# Patient Record
Sex: Male | Born: 1935 | Race: White | Hispanic: No | Smoking: Former smoker
Health system: Southern US, Community
[De-identification: ages and names within clinical notes are randomized; demographics above are authoritative.]

---

## 2011-04-27 DIAGNOSIS — E785 Hyperlipidemia, unspecified: Secondary | ICD-10-CM | POA: Diagnosis not present

## 2011-04-27 DIAGNOSIS — E041 Nontoxic single thyroid nodule: Secondary | ICD-10-CM | POA: Diagnosis not present

## 2011-04-27 DIAGNOSIS — M899 Disorder of bone, unspecified: Secondary | ICD-10-CM | POA: Diagnosis not present

## 2011-04-27 DIAGNOSIS — I10 Essential (primary) hypertension: Secondary | ICD-10-CM | POA: Diagnosis not present

## 2011-05-04 DIAGNOSIS — M4120 Other idiopathic scoliosis, site unspecified: Secondary | ICD-10-CM | POA: Diagnosis not present

## 2011-05-11 DIAGNOSIS — M47817 Spondylosis without myelopathy or radiculopathy, lumbosacral region: Secondary | ICD-10-CM | POA: Diagnosis not present

## 2011-05-11 DIAGNOSIS — M7130 Other bursal cyst, unspecified site: Secondary | ICD-10-CM | POA: Diagnosis not present

## 2011-05-11 DIAGNOSIS — M4120 Other idiopathic scoliosis, site unspecified: Secondary | ICD-10-CM | POA: Diagnosis not present

## 2011-05-12 DIAGNOSIS — M48061 Spinal stenosis, lumbar region without neurogenic claudication: Secondary | ICD-10-CM | POA: Diagnosis not present

## 2011-05-14 DIAGNOSIS — E041 Nontoxic single thyroid nodule: Secondary | ICD-10-CM | POA: Diagnosis not present

## 2011-05-14 DIAGNOSIS — E785 Hyperlipidemia, unspecified: Secondary | ICD-10-CM | POA: Diagnosis not present

## 2011-05-14 DIAGNOSIS — I10 Essential (primary) hypertension: Secondary | ICD-10-CM | POA: Diagnosis not present

## 2011-05-14 DIAGNOSIS — IMO0002 Reserved for concepts with insufficient information to code with codable children: Secondary | ICD-10-CM | POA: Diagnosis not present

## 2011-05-19 DIAGNOSIS — M5137 Other intervertebral disc degeneration, lumbosacral region: Secondary | ICD-10-CM | POA: Diagnosis not present

## 2011-05-24 DIAGNOSIS — I495 Sick sinus syndrome: Secondary | ICD-10-CM | POA: Diagnosis not present

## 2011-05-27 DIAGNOSIS — H9190 Unspecified hearing loss, unspecified ear: Secondary | ICD-10-CM | POA: Diagnosis not present

## 2011-05-27 DIAGNOSIS — Z7982 Long term (current) use of aspirin: Secondary | ICD-10-CM | POA: Diagnosis not present

## 2011-05-27 DIAGNOSIS — I10 Essential (primary) hypertension: Secondary | ICD-10-CM | POA: Diagnosis not present

## 2011-05-27 DIAGNOSIS — E7800 Pure hypercholesterolemia, unspecified: Secondary | ICD-10-CM | POA: Diagnosis not present

## 2011-05-27 DIAGNOSIS — Z79899 Other long term (current) drug therapy: Secondary | ICD-10-CM | POA: Diagnosis not present

## 2011-05-27 DIAGNOSIS — M48061 Spinal stenosis, lumbar region without neurogenic claudication: Secondary | ICD-10-CM | POA: Diagnosis not present

## 2011-05-27 DIAGNOSIS — M4120 Other idiopathic scoliosis, site unspecified: Secondary | ICD-10-CM | POA: Diagnosis not present

## 2011-06-11 DIAGNOSIS — R972 Elevated prostate specific antigen [PSA]: Secondary | ICD-10-CM | POA: Diagnosis not present

## 2011-06-11 DIAGNOSIS — N400 Enlarged prostate without lower urinary tract symptoms: Secondary | ICD-10-CM | POA: Diagnosis not present

## 2011-06-11 DIAGNOSIS — N529 Male erectile dysfunction, unspecified: Secondary | ICD-10-CM | POA: Diagnosis not present

## 2011-07-16 DIAGNOSIS — L570 Actinic keratosis: Secondary | ICD-10-CM | POA: Diagnosis not present

## 2011-07-16 DIAGNOSIS — L608 Other nail disorders: Secondary | ICD-10-CM | POA: Diagnosis not present

## 2011-07-27 DIAGNOSIS — H903 Sensorineural hearing loss, bilateral: Secondary | ICD-10-CM | POA: Diagnosis not present

## 2011-08-02 DIAGNOSIS — E785 Hyperlipidemia, unspecified: Secondary | ICD-10-CM | POA: Diagnosis not present

## 2011-08-02 DIAGNOSIS — M949 Disorder of cartilage, unspecified: Secondary | ICD-10-CM | POA: Diagnosis not present

## 2011-08-02 DIAGNOSIS — I10 Essential (primary) hypertension: Secondary | ICD-10-CM | POA: Diagnosis not present

## 2011-08-02 DIAGNOSIS — E041 Nontoxic single thyroid nodule: Secondary | ICD-10-CM | POA: Diagnosis not present

## 2011-11-08 DIAGNOSIS — E041 Nontoxic single thyroid nodule: Secondary | ICD-10-CM | POA: Diagnosis not present

## 2011-11-08 DIAGNOSIS — M899 Disorder of bone, unspecified: Secondary | ICD-10-CM | POA: Diagnosis not present

## 2011-11-08 DIAGNOSIS — I10 Essential (primary) hypertension: Secondary | ICD-10-CM | POA: Diagnosis not present

## 2011-11-08 DIAGNOSIS — M949 Disorder of cartilage, unspecified: Secondary | ICD-10-CM | POA: Diagnosis not present

## 2011-11-08 DIAGNOSIS — E785 Hyperlipidemia, unspecified: Secondary | ICD-10-CM | POA: Diagnosis not present

## 2011-12-10 DIAGNOSIS — R972 Elevated prostate specific antigen [PSA]: Secondary | ICD-10-CM | POA: Diagnosis not present

## 2011-12-14 DIAGNOSIS — N320 Bladder-neck obstruction: Secondary | ICD-10-CM | POA: Diagnosis not present

## 2011-12-14 DIAGNOSIS — N529 Male erectile dysfunction, unspecified: Secondary | ICD-10-CM | POA: Diagnosis not present

## 2011-12-14 DIAGNOSIS — N400 Enlarged prostate without lower urinary tract symptoms: Secondary | ICD-10-CM | POA: Diagnosis not present

## 2011-12-14 DIAGNOSIS — R972 Elevated prostate specific antigen [PSA]: Secondary | ICD-10-CM | POA: Diagnosis not present

## 2011-12-24 DIAGNOSIS — Z961 Presence of intraocular lens: Secondary | ICD-10-CM | POA: Diagnosis not present

## 2012-01-16 DIAGNOSIS — D1801 Hemangioma of skin and subcutaneous tissue: Secondary | ICD-10-CM | POA: Diagnosis not present

## 2012-02-14 DIAGNOSIS — I10 Essential (primary) hypertension: Secondary | ICD-10-CM | POA: Diagnosis not present

## 2012-02-14 DIAGNOSIS — Z23 Encounter for immunization: Secondary | ICD-10-CM | POA: Diagnosis not present

## 2012-02-14 DIAGNOSIS — E785 Hyperlipidemia, unspecified: Secondary | ICD-10-CM | POA: Diagnosis not present

## 2012-02-14 DIAGNOSIS — M899 Disorder of bone, unspecified: Secondary | ICD-10-CM | POA: Diagnosis not present

## 2012-02-28 DIAGNOSIS — M899 Disorder of bone, unspecified: Secondary | ICD-10-CM | POA: Diagnosis not present

## 2012-02-28 DIAGNOSIS — M949 Disorder of cartilage, unspecified: Secondary | ICD-10-CM | POA: Diagnosis not present

## 2012-02-28 DIAGNOSIS — M810 Age-related osteoporosis without current pathological fracture: Secondary | ICD-10-CM | POA: Diagnosis not present

## 2012-05-22 DIAGNOSIS — M810 Age-related osteoporosis without current pathological fracture: Secondary | ICD-10-CM | POA: Diagnosis not present

## 2012-05-22 DIAGNOSIS — E041 Nontoxic single thyroid nodule: Secondary | ICD-10-CM | POA: Diagnosis not present

## 2012-05-22 DIAGNOSIS — E559 Vitamin D deficiency, unspecified: Secondary | ICD-10-CM | POA: Diagnosis not present

## 2012-05-22 DIAGNOSIS — E785 Hyperlipidemia, unspecified: Secondary | ICD-10-CM | POA: Diagnosis not present

## 2012-05-22 DIAGNOSIS — I10 Essential (primary) hypertension: Secondary | ICD-10-CM | POA: Diagnosis not present

## 2012-06-02 DIAGNOSIS — I10 Essential (primary) hypertension: Secondary | ICD-10-CM | POA: Diagnosis not present

## 2012-06-02 DIAGNOSIS — E041 Nontoxic single thyroid nodule: Secondary | ICD-10-CM | POA: Diagnosis not present

## 2012-06-22 DIAGNOSIS — M810 Age-related osteoporosis without current pathological fracture: Secondary | ICD-10-CM | POA: Diagnosis not present

## 2012-07-11 DIAGNOSIS — D170 Benign lipomatous neoplasm of skin and subcutaneous tissue of head, face and neck: Secondary | ICD-10-CM | POA: Diagnosis not present

## 2012-07-11 DIAGNOSIS — E041 Nontoxic single thyroid nodule: Secondary | ICD-10-CM | POA: Diagnosis not present

## 2012-08-28 DIAGNOSIS — E785 Hyperlipidemia, unspecified: Secondary | ICD-10-CM | POA: Diagnosis not present

## 2012-08-28 DIAGNOSIS — M810 Age-related osteoporosis without current pathological fracture: Secondary | ICD-10-CM | POA: Diagnosis not present

## 2012-08-28 DIAGNOSIS — I10 Essential (primary) hypertension: Secondary | ICD-10-CM | POA: Diagnosis not present

## 2012-12-05 DIAGNOSIS — I10 Essential (primary) hypertension: Secondary | ICD-10-CM | POA: Diagnosis not present

## 2012-12-05 DIAGNOSIS — E785 Hyperlipidemia, unspecified: Secondary | ICD-10-CM | POA: Diagnosis not present

## 2012-12-05 DIAGNOSIS — M810 Age-related osteoporosis without current pathological fracture: Secondary | ICD-10-CM | POA: Diagnosis not present

## 2012-12-21 DIAGNOSIS — R972 Elevated prostate specific antigen [PSA]: Secondary | ICD-10-CM | POA: Diagnosis not present

## 2012-12-21 DIAGNOSIS — N320 Bladder-neck obstruction: Secondary | ICD-10-CM | POA: Diagnosis not present

## 2013-01-26 DIAGNOSIS — Z23 Encounter for immunization: Secondary | ICD-10-CM | POA: Diagnosis not present

## 2013-03-12 DIAGNOSIS — I10 Essential (primary) hypertension: Secondary | ICD-10-CM | POA: Diagnosis not present

## 2013-03-12 DIAGNOSIS — E785 Hyperlipidemia, unspecified: Secondary | ICD-10-CM | POA: Diagnosis not present

## 2013-03-12 DIAGNOSIS — M810 Age-related osteoporosis without current pathological fracture: Secondary | ICD-10-CM | POA: Diagnosis not present

## 2013-03-29 DIAGNOSIS — H903 Sensorineural hearing loss, bilateral: Secondary | ICD-10-CM | POA: Diagnosis not present

## 2013-03-29 DIAGNOSIS — J342 Deviated nasal septum: Secondary | ICD-10-CM | POA: Diagnosis not present

## 2013-03-29 DIAGNOSIS — H6120 Impacted cerumen, unspecified ear: Secondary | ICD-10-CM | POA: Diagnosis not present

## 2013-05-01 DIAGNOSIS — H26499 Other secondary cataract, unspecified eye: Secondary | ICD-10-CM | POA: Diagnosis not present

## 2013-05-01 DIAGNOSIS — H524 Presbyopia: Secondary | ICD-10-CM | POA: Diagnosis not present

## 2013-05-01 DIAGNOSIS — Z961 Presence of intraocular lens: Secondary | ICD-10-CM | POA: Diagnosis not present

## 2013-05-15 DIAGNOSIS — H26499 Other secondary cataract, unspecified eye: Secondary | ICD-10-CM | POA: Diagnosis not present

## 2013-06-02 DIAGNOSIS — R233 Spontaneous ecchymoses: Secondary | ICD-10-CM | POA: Diagnosis not present

## 2013-06-02 DIAGNOSIS — L570 Actinic keratosis: Secondary | ICD-10-CM | POA: Diagnosis not present

## 2013-06-19 DIAGNOSIS — I10 Essential (primary) hypertension: Secondary | ICD-10-CM | POA: Diagnosis not present

## 2013-06-19 DIAGNOSIS — E785 Hyperlipidemia, unspecified: Secondary | ICD-10-CM | POA: Diagnosis not present

## 2013-09-26 DIAGNOSIS — M810 Age-related osteoporosis without current pathological fracture: Secondary | ICD-10-CM | POA: Diagnosis not present

## 2013-09-26 DIAGNOSIS — E785 Hyperlipidemia, unspecified: Secondary | ICD-10-CM | POA: Diagnosis not present

## 2013-09-26 DIAGNOSIS — I10 Essential (primary) hypertension: Secondary | ICD-10-CM | POA: Diagnosis not present

## 2013-11-21 DIAGNOSIS — H9319 Tinnitus, unspecified ear: Secondary | ICD-10-CM | POA: Diagnosis not present

## 2013-11-21 DIAGNOSIS — H903 Sensorineural hearing loss, bilateral: Secondary | ICD-10-CM | POA: Diagnosis not present

## 2013-11-26 DIAGNOSIS — H26499 Other secondary cataract, unspecified eye: Secondary | ICD-10-CM | POA: Diagnosis not present

## 2013-12-04 DIAGNOSIS — M48061 Spinal stenosis, lumbar region without neurogenic claudication: Secondary | ICD-10-CM | POA: Diagnosis not present

## 2013-12-04 DIAGNOSIS — M4310 Spondylolisthesis, site unspecified: Secondary | ICD-10-CM | POA: Diagnosis not present

## 2013-12-04 DIAGNOSIS — IMO0002 Reserved for concepts with insufficient information to code with codable children: Secondary | ICD-10-CM | POA: Diagnosis not present

## 2013-12-05 DIAGNOSIS — M4310 Spondylolisthesis, site unspecified: Secondary | ICD-10-CM | POA: Diagnosis not present

## 2013-12-07 DIAGNOSIS — R52 Pain, unspecified: Secondary | ICD-10-CM | POA: Diagnosis not present

## 2013-12-07 DIAGNOSIS — Z79899 Other long term (current) drug therapy: Secondary | ICD-10-CM | POA: Diagnosis not present

## 2013-12-07 DIAGNOSIS — Z0389 Encounter for observation for other suspected diseases and conditions ruled out: Secondary | ICD-10-CM | POA: Diagnosis not present

## 2013-12-07 DIAGNOSIS — M79609 Pain in unspecified limb: Secondary | ICD-10-CM | POA: Diagnosis not present

## 2013-12-07 DIAGNOSIS — Z01812 Encounter for preprocedural laboratory examination: Secondary | ICD-10-CM | POA: Diagnosis not present

## 2013-12-07 DIAGNOSIS — Z01818 Encounter for other preprocedural examination: Secondary | ICD-10-CM | POA: Diagnosis not present

## 2013-12-11 DIAGNOSIS — M4310 Spondylolisthesis, site unspecified: Secondary | ICD-10-CM | POA: Diagnosis not present

## 2013-12-24 DIAGNOSIS — Z87891 Personal history of nicotine dependence: Secondary | ICD-10-CM | POA: Diagnosis not present

## 2013-12-24 DIAGNOSIS — IMO0002 Reserved for concepts with insufficient information to code with codable children: Secondary | ICD-10-CM | POA: Diagnosis not present

## 2013-12-24 DIAGNOSIS — H903 Sensorineural hearing loss, bilateral: Secondary | ICD-10-CM | POA: Diagnosis present

## 2013-12-24 DIAGNOSIS — Z23 Encounter for immunization: Secondary | ICD-10-CM | POA: Diagnosis not present

## 2013-12-24 DIAGNOSIS — M4310 Spondylolisthesis, site unspecified: Secondary | ICD-10-CM | POA: Diagnosis not present

## 2013-12-24 DIAGNOSIS — M51379 Other intervertebral disc degeneration, lumbosacral region without mention of lumbar back pain or lower extremity pain: Secondary | ICD-10-CM | POA: Diagnosis present

## 2013-12-24 DIAGNOSIS — M48061 Spinal stenosis, lumbar region without neurogenic claudication: Secondary | ICD-10-CM | POA: Diagnosis not present

## 2013-12-24 DIAGNOSIS — Z79899 Other long term (current) drug therapy: Secondary | ICD-10-CM | POA: Diagnosis not present

## 2013-12-24 DIAGNOSIS — I10 Essential (primary) hypertension: Secondary | ICD-10-CM | POA: Diagnosis present

## 2013-12-24 DIAGNOSIS — M5137 Other intervertebral disc degeneration, lumbosacral region: Secondary | ICD-10-CM | POA: Diagnosis not present

## 2013-12-24 DIAGNOSIS — K219 Gastro-esophageal reflux disease without esophagitis: Secondary | ICD-10-CM | POA: Diagnosis present

## 2013-12-24 DIAGNOSIS — M4120 Other idiopathic scoliosis, site unspecified: Secondary | ICD-10-CM | POA: Diagnosis present

## 2013-12-24 DIAGNOSIS — E7800 Pure hypercholesterolemia, unspecified: Secondary | ICD-10-CM | POA: Diagnosis not present

## 2014-01-15 DIAGNOSIS — M549 Dorsalgia, unspecified: Secondary | ICD-10-CM | POA: Diagnosis not present

## 2014-01-15 DIAGNOSIS — M810 Age-related osteoporosis without current pathological fracture: Secondary | ICD-10-CM | POA: Diagnosis not present

## 2014-01-15 DIAGNOSIS — E785 Hyperlipidemia, unspecified: Secondary | ICD-10-CM | POA: Diagnosis not present

## 2014-01-15 DIAGNOSIS — Z23 Encounter for immunization: Secondary | ICD-10-CM | POA: Diagnosis not present

## 2014-01-31 DIAGNOSIS — L570 Actinic keratosis: Secondary | ICD-10-CM | POA: Diagnosis not present

## 2014-02-06 DIAGNOSIS — Z9889 Other specified postprocedural states: Secondary | ICD-10-CM | POA: Diagnosis not present

## 2014-03-26 DIAGNOSIS — M810 Age-related osteoporosis without current pathological fracture: Secondary | ICD-10-CM | POA: Diagnosis not present

## 2014-03-26 DIAGNOSIS — E785 Hyperlipidemia, unspecified: Secondary | ICD-10-CM | POA: Diagnosis not present

## 2014-05-07 DIAGNOSIS — Z9889 Other specified postprocedural states: Secondary | ICD-10-CM | POA: Diagnosis not present

## 2014-06-07 DIAGNOSIS — N400 Enlarged prostate without lower urinary tract symptoms: Secondary | ICD-10-CM | POA: Diagnosis not present

## 2014-06-07 DIAGNOSIS — R972 Elevated prostate specific antigen [PSA]: Secondary | ICD-10-CM | POA: Diagnosis not present

## 2014-06-18 DIAGNOSIS — L300 Nummular dermatitis: Secondary | ICD-10-CM | POA: Diagnosis not present

## 2014-07-02 DIAGNOSIS — E785 Hyperlipidemia, unspecified: Secondary | ICD-10-CM | POA: Diagnosis not present

## 2014-07-02 DIAGNOSIS — I10 Essential (primary) hypertension: Secondary | ICD-10-CM | POA: Diagnosis not present

## 2014-07-02 DIAGNOSIS — Z23 Encounter for immunization: Secondary | ICD-10-CM | POA: Diagnosis not present

## 2014-07-16 DIAGNOSIS — Z1212 Encounter for screening for malignant neoplasm of rectum: Secondary | ICD-10-CM | POA: Diagnosis not present

## 2014-07-17 DIAGNOSIS — M810 Age-related osteoporosis without current pathological fracture: Secondary | ICD-10-CM | POA: Diagnosis not present

## 2014-07-19 DIAGNOSIS — L989 Disorder of the skin and subcutaneous tissue, unspecified: Secondary | ICD-10-CM | POA: Diagnosis not present

## 2014-07-20 DIAGNOSIS — L300 Nummular dermatitis: Secondary | ICD-10-CM | POA: Diagnosis not present

## 2014-09-16 DIAGNOSIS — H6123 Impacted cerumen, bilateral: Secondary | ICD-10-CM | POA: Diagnosis not present

## 2014-10-06 DIAGNOSIS — L509 Urticaria, unspecified: Secondary | ICD-10-CM | POA: Diagnosis not present

## 2014-10-06 DIAGNOSIS — T7840XA Allergy, unspecified, initial encounter: Secondary | ICD-10-CM | POA: Diagnosis not present

## 2014-10-07 DIAGNOSIS — E785 Hyperlipidemia, unspecified: Secondary | ICD-10-CM | POA: Diagnosis not present

## 2014-10-07 DIAGNOSIS — I10 Essential (primary) hypertension: Secondary | ICD-10-CM | POA: Diagnosis not present

## 2014-10-07 DIAGNOSIS — M810 Age-related osteoporosis without current pathological fracture: Secondary | ICD-10-CM | POA: Diagnosis not present

## 2014-10-07 DIAGNOSIS — K219 Gastro-esophageal reflux disease without esophagitis: Secondary | ICD-10-CM | POA: Diagnosis not present

## 2014-10-11 DIAGNOSIS — M810 Age-related osteoporosis without current pathological fracture: Secondary | ICD-10-CM | POA: Diagnosis not present

## 2014-10-11 DIAGNOSIS — E785 Hyperlipidemia, unspecified: Secondary | ICD-10-CM | POA: Diagnosis not present

## 2014-10-15 DIAGNOSIS — Z9181 History of falling: Secondary | ICD-10-CM | POA: Diagnosis not present

## 2014-10-15 DIAGNOSIS — M21372 Foot drop, left foot: Secondary | ICD-10-CM | POA: Diagnosis not present

## 2014-10-15 DIAGNOSIS — M545 Low back pain: Secondary | ICD-10-CM | POA: Diagnosis not present

## 2014-10-18 DIAGNOSIS — Z9181 History of falling: Secondary | ICD-10-CM | POA: Diagnosis not present

## 2014-10-18 DIAGNOSIS — M21372 Foot drop, left foot: Secondary | ICD-10-CM | POA: Diagnosis not present

## 2014-10-18 DIAGNOSIS — M545 Low back pain: Secondary | ICD-10-CM | POA: Diagnosis not present

## 2014-10-21 DIAGNOSIS — K5140 Inflammatory polyps of colon without complications: Secondary | ICD-10-CM | POA: Diagnosis not present

## 2014-10-21 DIAGNOSIS — D125 Benign neoplasm of sigmoid colon: Secondary | ICD-10-CM | POA: Diagnosis not present

## 2014-10-21 DIAGNOSIS — Z86010 Personal history of colonic polyps: Secondary | ICD-10-CM | POA: Diagnosis not present

## 2014-10-24 DIAGNOSIS — M545 Low back pain: Secondary | ICD-10-CM | POA: Diagnosis not present

## 2014-10-24 DIAGNOSIS — M21372 Foot drop, left foot: Secondary | ICD-10-CM | POA: Diagnosis not present

## 2014-10-24 DIAGNOSIS — Z9181 History of falling: Secondary | ICD-10-CM | POA: Diagnosis not present

## 2014-10-28 DIAGNOSIS — M810 Age-related osteoporosis without current pathological fracture: Secondary | ICD-10-CM | POA: Diagnosis not present

## 2014-10-29 DIAGNOSIS — J45909 Unspecified asthma, uncomplicated: Secondary | ICD-10-CM | POA: Diagnosis not present

## 2014-10-30 DIAGNOSIS — Z9181 History of falling: Secondary | ICD-10-CM | POA: Diagnosis not present

## 2014-10-30 DIAGNOSIS — M545 Low back pain: Secondary | ICD-10-CM | POA: Diagnosis not present

## 2014-10-30 DIAGNOSIS — M21372 Foot drop, left foot: Secondary | ICD-10-CM | POA: Diagnosis not present

## 2014-11-01 DIAGNOSIS — Z9181 History of falling: Secondary | ICD-10-CM | POA: Diagnosis not present

## 2014-11-01 DIAGNOSIS — M545 Low back pain: Secondary | ICD-10-CM | POA: Diagnosis not present

## 2014-11-01 DIAGNOSIS — M21372 Foot drop, left foot: Secondary | ICD-10-CM | POA: Diagnosis not present

## 2014-11-04 DIAGNOSIS — Z9181 History of falling: Secondary | ICD-10-CM | POA: Diagnosis not present

## 2014-11-04 DIAGNOSIS — M545 Low back pain: Secondary | ICD-10-CM | POA: Diagnosis not present

## 2014-11-04 DIAGNOSIS — M21372 Foot drop, left foot: Secondary | ICD-10-CM | POA: Diagnosis not present

## 2014-11-05 DIAGNOSIS — Z9889 Other specified postprocedural states: Secondary | ICD-10-CM | POA: Diagnosis not present

## 2014-11-08 DIAGNOSIS — M545 Low back pain: Secondary | ICD-10-CM | POA: Diagnosis not present

## 2014-11-08 DIAGNOSIS — L272 Dermatitis due to ingested food: Secondary | ICD-10-CM | POA: Diagnosis not present

## 2014-11-08 DIAGNOSIS — M21372 Foot drop, left foot: Secondary | ICD-10-CM | POA: Diagnosis not present

## 2014-11-08 DIAGNOSIS — Z9181 History of falling: Secondary | ICD-10-CM | POA: Diagnosis not present

## 2014-11-14 DIAGNOSIS — Z9181 History of falling: Secondary | ICD-10-CM | POA: Diagnosis not present

## 2014-11-14 DIAGNOSIS — M21372 Foot drop, left foot: Secondary | ICD-10-CM | POA: Diagnosis not present

## 2014-11-14 DIAGNOSIS — M545 Low back pain: Secondary | ICD-10-CM | POA: Diagnosis not present

## 2014-12-02 DIAGNOSIS — H26493 Other secondary cataract, bilateral: Secondary | ICD-10-CM | POA: Diagnosis not present

## 2015-01-06 DIAGNOSIS — E785 Hyperlipidemia, unspecified: Secondary | ICD-10-CM | POA: Diagnosis not present

## 2015-01-06 DIAGNOSIS — M549 Dorsalgia, unspecified: Secondary | ICD-10-CM | POA: Diagnosis not present

## 2015-01-06 DIAGNOSIS — Z23 Encounter for immunization: Secondary | ICD-10-CM | POA: Diagnosis not present

## 2015-01-06 DIAGNOSIS — I10 Essential (primary) hypertension: Secondary | ICD-10-CM | POA: Diagnosis not present

## 2015-01-06 DIAGNOSIS — M810 Age-related osteoporosis without current pathological fracture: Secondary | ICD-10-CM | POA: Diagnosis not present

## 2015-05-05 DIAGNOSIS — E785 Hyperlipidemia, unspecified: Secondary | ICD-10-CM | POA: Diagnosis not present

## 2015-05-05 DIAGNOSIS — K219 Gastro-esophageal reflux disease without esophagitis: Secondary | ICD-10-CM | POA: Diagnosis not present

## 2015-05-05 DIAGNOSIS — M549 Dorsalgia, unspecified: Secondary | ICD-10-CM | POA: Diagnosis not present

## 2015-05-05 DIAGNOSIS — M810 Age-related osteoporosis without current pathological fracture: Secondary | ICD-10-CM | POA: Diagnosis not present

## 2015-05-05 DIAGNOSIS — I10 Essential (primary) hypertension: Secondary | ICD-10-CM | POA: Diagnosis not present

## 2015-07-29 DIAGNOSIS — Z6821 Body mass index (BMI) 21.0-21.9, adult: Secondary | ICD-10-CM | POA: Diagnosis not present

## 2015-07-29 DIAGNOSIS — R972 Elevated prostate specific antigen [PSA]: Secondary | ICD-10-CM | POA: Diagnosis not present

## 2015-07-29 DIAGNOSIS — M810 Age-related osteoporosis without current pathological fracture: Secondary | ICD-10-CM | POA: Diagnosis not present

## 2015-07-29 DIAGNOSIS — M549 Dorsalgia, unspecified: Secondary | ICD-10-CM | POA: Diagnosis not present

## 2015-07-29 DIAGNOSIS — Z1212 Encounter for screening for malignant neoplasm of rectum: Secondary | ICD-10-CM | POA: Diagnosis not present

## 2015-07-29 DIAGNOSIS — E559 Vitamin D deficiency, unspecified: Secondary | ICD-10-CM | POA: Diagnosis not present

## 2015-07-29 DIAGNOSIS — E785 Hyperlipidemia, unspecified: Secondary | ICD-10-CM | POA: Diagnosis not present

## 2015-07-29 DIAGNOSIS — N521 Erectile dysfunction due to diseases classified elsewhere: Secondary | ICD-10-CM | POA: Diagnosis not present

## 2015-07-29 DIAGNOSIS — Z125 Encounter for screening for malignant neoplasm of prostate: Secondary | ICD-10-CM | POA: Diagnosis not present

## 2015-07-29 DIAGNOSIS — Z1389 Encounter for screening for other disorder: Secondary | ICD-10-CM | POA: Diagnosis not present

## 2015-07-29 DIAGNOSIS — E041 Nontoxic single thyroid nodule: Secondary | ICD-10-CM | POA: Diagnosis not present

## 2015-11-04 DIAGNOSIS — R369 Urethral discharge, unspecified: Secondary | ICD-10-CM | POA: Diagnosis not present

## 2015-11-04 DIAGNOSIS — M810 Age-related osteoporosis without current pathological fracture: Secondary | ICD-10-CM | POA: Diagnosis not present

## 2015-11-04 DIAGNOSIS — E785 Hyperlipidemia, unspecified: Secondary | ICD-10-CM | POA: Diagnosis not present

## 2015-11-04 DIAGNOSIS — M549 Dorsalgia, unspecified: Secondary | ICD-10-CM | POA: Diagnosis not present

## 2015-11-04 DIAGNOSIS — K219 Gastro-esophageal reflux disease without esophagitis: Secondary | ICD-10-CM | POA: Diagnosis not present

## 2015-11-17 DIAGNOSIS — M549 Dorsalgia, unspecified: Secondary | ICD-10-CM | POA: Diagnosis not present

## 2015-12-08 DIAGNOSIS — H524 Presbyopia: Secondary | ICD-10-CM | POA: Diagnosis not present

## 2015-12-08 DIAGNOSIS — H26493 Other secondary cataract, bilateral: Secondary | ICD-10-CM | POA: Diagnosis not present

## 2015-12-09 DIAGNOSIS — M810 Age-related osteoporosis without current pathological fracture: Secondary | ICD-10-CM | POA: Diagnosis not present

## 2016-02-03 DIAGNOSIS — E785 Hyperlipidemia, unspecified: Secondary | ICD-10-CM | POA: Diagnosis not present

## 2016-02-03 DIAGNOSIS — Z23 Encounter for immunization: Secondary | ICD-10-CM | POA: Diagnosis not present

## 2016-02-03 DIAGNOSIS — M810 Age-related osteoporosis without current pathological fracture: Secondary | ICD-10-CM | POA: Diagnosis not present

## 2016-02-03 DIAGNOSIS — M549 Dorsalgia, unspecified: Secondary | ICD-10-CM | POA: Diagnosis not present

## 2016-02-17 DIAGNOSIS — D044 Carcinoma in situ of skin of scalp and neck: Secondary | ICD-10-CM | POA: Diagnosis not present

## 2016-02-17 DIAGNOSIS — L570 Actinic keratosis: Secondary | ICD-10-CM | POA: Diagnosis not present

## 2016-04-26 DIAGNOSIS — H6123 Impacted cerumen, bilateral: Secondary | ICD-10-CM | POA: Diagnosis not present

## 2016-05-11 DIAGNOSIS — M810 Age-related osteoporosis without current pathological fracture: Secondary | ICD-10-CM | POA: Diagnosis not present

## 2016-05-11 DIAGNOSIS — M545 Low back pain: Secondary | ICD-10-CM | POA: Diagnosis not present

## 2016-05-11 DIAGNOSIS — I10 Essential (primary) hypertension: Secondary | ICD-10-CM | POA: Diagnosis not present

## 2016-05-11 DIAGNOSIS — E785 Hyperlipidemia, unspecified: Secondary | ICD-10-CM | POA: Diagnosis not present

## 2016-08-09 DIAGNOSIS — Z6821 Body mass index (BMI) 21.0-21.9, adult: Secondary | ICD-10-CM | POA: Diagnosis not present

## 2016-08-09 DIAGNOSIS — Z0000 Encounter for general adult medical examination without abnormal findings: Secondary | ICD-10-CM | POA: Diagnosis not present

## 2016-08-09 DIAGNOSIS — R351 Nocturia: Secondary | ICD-10-CM | POA: Diagnosis not present

## 2016-08-09 DIAGNOSIS — I10 Essential (primary) hypertension: Secondary | ICD-10-CM | POA: Diagnosis not present

## 2016-08-09 DIAGNOSIS — E785 Hyperlipidemia, unspecified: Secondary | ICD-10-CM | POA: Diagnosis not present

## 2016-08-09 DIAGNOSIS — Z1212 Encounter for screening for malignant neoplasm of rectum: Secondary | ICD-10-CM | POA: Diagnosis not present

## 2016-08-09 DIAGNOSIS — Z1389 Encounter for screening for other disorder: Secondary | ICD-10-CM | POA: Diagnosis not present

## 2016-08-27 DIAGNOSIS — M810 Age-related osteoporosis without current pathological fracture: Secondary | ICD-10-CM | POA: Diagnosis not present

## 2016-11-08 ENCOUNTER — Other Ambulatory Visit: Payer: Self-pay

## 2016-11-10 DIAGNOSIS — M549 Dorsalgia, unspecified: Secondary | ICD-10-CM | POA: Diagnosis not present

## 2016-11-10 DIAGNOSIS — E785 Hyperlipidemia, unspecified: Secondary | ICD-10-CM | POA: Diagnosis not present

## 2016-11-10 DIAGNOSIS — M810 Age-related osteoporosis without current pathological fracture: Secondary | ICD-10-CM | POA: Diagnosis not present

## 2016-11-23 DIAGNOSIS — E785 Hyperlipidemia, unspecified: Secondary | ICD-10-CM | POA: Diagnosis not present

## 2016-11-23 DIAGNOSIS — Z6820 Body mass index (BMI) 20.0-20.9, adult: Secondary | ICD-10-CM | POA: Diagnosis not present

## 2016-11-23 DIAGNOSIS — M810 Age-related osteoporosis without current pathological fracture: Secondary | ICD-10-CM | POA: Diagnosis not present

## 2016-11-23 DIAGNOSIS — K219 Gastro-esophageal reflux disease without esophagitis: Secondary | ICD-10-CM | POA: Diagnosis not present

## 2016-12-09 DIAGNOSIS — M810 Age-related osteoporosis without current pathological fracture: Secondary | ICD-10-CM | POA: Diagnosis not present

## 2016-12-09 DIAGNOSIS — H26493 Other secondary cataract, bilateral: Secondary | ICD-10-CM | POA: Diagnosis not present

## 2017-02-14 DIAGNOSIS — I10 Essential (primary) hypertension: Secondary | ICD-10-CM | POA: Diagnosis not present

## 2017-02-14 DIAGNOSIS — M549 Dorsalgia, unspecified: Secondary | ICD-10-CM | POA: Diagnosis not present

## 2017-02-14 DIAGNOSIS — E785 Hyperlipidemia, unspecified: Secondary | ICD-10-CM | POA: Diagnosis not present

## 2017-02-14 DIAGNOSIS — Z23 Encounter for immunization: Secondary | ICD-10-CM | POA: Diagnosis not present

## 2017-02-14 DIAGNOSIS — M810 Age-related osteoporosis without current pathological fracture: Secondary | ICD-10-CM | POA: Diagnosis not present

## 2017-05-17 DIAGNOSIS — M549 Dorsalgia, unspecified: Secondary | ICD-10-CM | POA: Diagnosis not present

## 2017-05-17 DIAGNOSIS — I10 Essential (primary) hypertension: Secondary | ICD-10-CM | POA: Diagnosis not present

## 2017-05-17 DIAGNOSIS — M810 Age-related osteoporosis without current pathological fracture: Secondary | ICD-10-CM | POA: Diagnosis not present

## 2017-05-17 DIAGNOSIS — E785 Hyperlipidemia, unspecified: Secondary | ICD-10-CM | POA: Diagnosis not present

## 2017-08-15 DIAGNOSIS — E785 Hyperlipidemia, unspecified: Secondary | ICD-10-CM | POA: Diagnosis not present

## 2017-08-15 DIAGNOSIS — G8929 Other chronic pain: Secondary | ICD-10-CM | POA: Diagnosis not present

## 2017-08-15 DIAGNOSIS — M545 Low back pain: Secondary | ICD-10-CM | POA: Diagnosis not present

## 2017-08-15 DIAGNOSIS — I10 Essential (primary) hypertension: Secondary | ICD-10-CM | POA: Diagnosis not present

## 2017-11-15 DIAGNOSIS — E7800 Pure hypercholesterolemia, unspecified: Secondary | ICD-10-CM | POA: Diagnosis not present

## 2017-11-15 DIAGNOSIS — M545 Low back pain: Secondary | ICD-10-CM | POA: Diagnosis not present

## 2017-11-15 DIAGNOSIS — I10 Essential (primary) hypertension: Secondary | ICD-10-CM | POA: Diagnosis not present

## 2017-11-15 DIAGNOSIS — G8929 Other chronic pain: Secondary | ICD-10-CM | POA: Diagnosis not present

## 2017-12-23 DIAGNOSIS — H04122 Dry eye syndrome of left lacrimal gland: Secondary | ICD-10-CM | POA: Diagnosis not present

## 2017-12-23 DIAGNOSIS — H26493 Other secondary cataract, bilateral: Secondary | ICD-10-CM | POA: Diagnosis not present

## 2018-02-20 DIAGNOSIS — Z6820 Body mass index (BMI) 20.0-20.9, adult: Secondary | ICD-10-CM | POA: Diagnosis not present

## 2018-02-20 DIAGNOSIS — E7800 Pure hypercholesterolemia, unspecified: Secondary | ICD-10-CM | POA: Diagnosis not present

## 2018-02-20 DIAGNOSIS — Z1389 Encounter for screening for other disorder: Secondary | ICD-10-CM | POA: Diagnosis not present

## 2018-02-20 DIAGNOSIS — Z125 Encounter for screening for malignant neoplasm of prostate: Secondary | ICD-10-CM | POA: Diagnosis not present

## 2018-02-20 DIAGNOSIS — Z1212 Encounter for screening for malignant neoplasm of rectum: Secondary | ICD-10-CM | POA: Diagnosis not present

## 2018-02-20 DIAGNOSIS — M810 Age-related osteoporosis without current pathological fracture: Secondary | ICD-10-CM | POA: Diagnosis not present

## 2018-03-06 DIAGNOSIS — H6122 Impacted cerumen, left ear: Secondary | ICD-10-CM | POA: Diagnosis not present

## 2018-06-07 DIAGNOSIS — G8929 Other chronic pain: Secondary | ICD-10-CM | POA: Diagnosis not present

## 2018-06-07 DIAGNOSIS — M5442 Lumbago with sciatica, left side: Secondary | ICD-10-CM | POA: Diagnosis not present

## 2018-06-07 DIAGNOSIS — I10 Essential (primary) hypertension: Secondary | ICD-10-CM | POA: Diagnosis not present

## 2018-06-07 DIAGNOSIS — R972 Elevated prostate specific antigen [PSA]: Secondary | ICD-10-CM | POA: Diagnosis not present

## 2018-06-07 DIAGNOSIS — E7800 Pure hypercholesterolemia, unspecified: Secondary | ICD-10-CM | POA: Diagnosis not present

## 2018-06-13 DIAGNOSIS — L853 Xerosis cutis: Secondary | ICD-10-CM | POA: Diagnosis not present

## 2018-06-13 DIAGNOSIS — L570 Actinic keratosis: Secondary | ICD-10-CM | POA: Diagnosis not present

## 2018-06-13 DIAGNOSIS — L301 Dyshidrosis [pompholyx]: Secondary | ICD-10-CM | POA: Diagnosis not present

## 2018-09-08 DIAGNOSIS — E785 Hyperlipidemia, unspecified: Secondary | ICD-10-CM | POA: Diagnosis not present

## 2018-09-08 DIAGNOSIS — R972 Elevated prostate specific antigen [PSA]: Secondary | ICD-10-CM | POA: Diagnosis not present

## 2018-09-08 DIAGNOSIS — I10 Essential (primary) hypertension: Secondary | ICD-10-CM | POA: Diagnosis not present

## 2018-09-08 DIAGNOSIS — M810 Age-related osteoporosis without current pathological fracture: Secondary | ICD-10-CM | POA: Diagnosis not present

## 2018-12-08 DIAGNOSIS — M810 Age-related osteoporosis without current pathological fracture: Secondary | ICD-10-CM | POA: Diagnosis not present

## 2018-12-08 DIAGNOSIS — R972 Elevated prostate specific antigen [PSA]: Secondary | ICD-10-CM | POA: Diagnosis not present

## 2018-12-08 DIAGNOSIS — I10 Essential (primary) hypertension: Secondary | ICD-10-CM | POA: Diagnosis not present

## 2018-12-08 DIAGNOSIS — E785 Hyperlipidemia, unspecified: Secondary | ICD-10-CM | POA: Diagnosis not present

## 2019-01-01 DIAGNOSIS — Z23 Encounter for immunization: Secondary | ICD-10-CM | POA: Diagnosis not present

## 2019-01-04 DIAGNOSIS — Z961 Presence of intraocular lens: Secondary | ICD-10-CM | POA: Diagnosis not present

## 2019-02-27 DIAGNOSIS — Z6820 Body mass index (BMI) 20.0-20.9, adult: Secondary | ICD-10-CM | POA: Diagnosis not present

## 2019-02-27 DIAGNOSIS — M818 Other osteoporosis without current pathological fracture: Secondary | ICD-10-CM | POA: Diagnosis not present

## 2019-02-27 DIAGNOSIS — Z1389 Encounter for screening for other disorder: Secondary | ICD-10-CM | POA: Diagnosis not present

## 2019-02-27 DIAGNOSIS — I10 Essential (primary) hypertension: Secondary | ICD-10-CM | POA: Diagnosis not present

## 2019-02-27 DIAGNOSIS — Z125 Encounter for screening for malignant neoplasm of prostate: Secondary | ICD-10-CM | POA: Diagnosis not present

## 2019-02-27 DIAGNOSIS — E782 Mixed hyperlipidemia: Secondary | ICD-10-CM | POA: Diagnosis not present

## 2019-03-05 DIAGNOSIS — M810 Age-related osteoporosis without current pathological fracture: Secondary | ICD-10-CM | POA: Diagnosis not present

## 2019-03-05 DIAGNOSIS — M818 Other osteoporosis without current pathological fracture: Secondary | ICD-10-CM | POA: Diagnosis not present

## 2019-03-16 DIAGNOSIS — M810 Age-related osteoporosis without current pathological fracture: Secondary | ICD-10-CM | POA: Diagnosis not present

## 2019-05-11 DIAGNOSIS — H6120 Impacted cerumen, unspecified ear: Secondary | ICD-10-CM | POA: Diagnosis not present

## 2019-05-11 DIAGNOSIS — H61303 Acquired stenosis of external ear canal, unspecified, bilateral: Secondary | ICD-10-CM | POA: Diagnosis not present

## 2019-05-11 DIAGNOSIS — H9193 Unspecified hearing loss, bilateral: Secondary | ICD-10-CM | POA: Diagnosis not present

## 2019-05-30 DIAGNOSIS — E782 Mixed hyperlipidemia: Secondary | ICD-10-CM | POA: Diagnosis not present

## 2019-05-30 DIAGNOSIS — I10 Essential (primary) hypertension: Secondary | ICD-10-CM | POA: Diagnosis not present

## 2019-08-28 DIAGNOSIS — M1990 Unspecified osteoarthritis, unspecified site: Secondary | ICD-10-CM | POA: Diagnosis not present

## 2019-08-28 DIAGNOSIS — I10 Essential (primary) hypertension: Secondary | ICD-10-CM | POA: Diagnosis not present

## 2019-08-28 DIAGNOSIS — M810 Age-related osteoporosis without current pathological fracture: Secondary | ICD-10-CM | POA: Diagnosis not present

## 2019-09-24 DIAGNOSIS — H903 Sensorineural hearing loss, bilateral: Secondary | ICD-10-CM | POA: Diagnosis not present

## 2019-11-09 DIAGNOSIS — H61303 Acquired stenosis of external ear canal, unspecified, bilateral: Secondary | ICD-10-CM | POA: Diagnosis not present

## 2019-11-09 DIAGNOSIS — H6123 Impacted cerumen, bilateral: Secondary | ICD-10-CM | POA: Diagnosis not present

## 2019-11-09 DIAGNOSIS — H9193 Unspecified hearing loss, bilateral: Secondary | ICD-10-CM | POA: Diagnosis not present

## 2019-11-13 DIAGNOSIS — M4316 Spondylolisthesis, lumbar region: Secondary | ICD-10-CM | POA: Diagnosis not present

## 2019-11-13 DIAGNOSIS — Z9889 Other specified postprocedural states: Secondary | ICD-10-CM | POA: Diagnosis not present

## 2019-11-30 DIAGNOSIS — R2689 Other abnormalities of gait and mobility: Secondary | ICD-10-CM | POA: Diagnosis not present

## 2019-11-30 DIAGNOSIS — M21372 Foot drop, left foot: Secondary | ICD-10-CM | POA: Diagnosis not present

## 2019-11-30 DIAGNOSIS — I10 Essential (primary) hypertension: Secondary | ICD-10-CM | POA: Diagnosis not present

## 2019-11-30 DIAGNOSIS — E782 Mixed hyperlipidemia: Secondary | ICD-10-CM | POA: Diagnosis not present

## 2019-11-30 DIAGNOSIS — M818 Other osteoporosis without current pathological fracture: Secondary | ICD-10-CM | POA: Diagnosis not present

## 2019-12-08 DIAGNOSIS — E7800 Pure hypercholesterolemia, unspecified: Secondary | ICD-10-CM | POA: Diagnosis not present

## 2019-12-08 DIAGNOSIS — E782 Mixed hyperlipidemia: Secondary | ICD-10-CM | POA: Diagnosis not present

## 2019-12-08 DIAGNOSIS — J45909 Unspecified asthma, uncomplicated: Secondary | ICD-10-CM | POA: Diagnosis not present

## 2019-12-08 DIAGNOSIS — E041 Nontoxic single thyroid nodule: Secondary | ICD-10-CM | POA: Diagnosis not present

## 2019-12-08 DIAGNOSIS — M419 Scoliosis, unspecified: Secondary | ICD-10-CM | POA: Diagnosis not present

## 2019-12-08 DIAGNOSIS — M21372 Foot drop, left foot: Secondary | ICD-10-CM | POA: Diagnosis not present

## 2019-12-08 DIAGNOSIS — M818 Other osteoporosis without current pathological fracture: Secondary | ICD-10-CM | POA: Diagnosis not present

## 2019-12-08 DIAGNOSIS — M1990 Unspecified osteoarthritis, unspecified site: Secondary | ICD-10-CM | POA: Diagnosis not present

## 2019-12-08 DIAGNOSIS — Z87891 Personal history of nicotine dependence: Secondary | ICD-10-CM | POA: Diagnosis not present

## 2019-12-08 DIAGNOSIS — Z7982 Long term (current) use of aspirin: Secondary | ICD-10-CM | POA: Diagnosis not present

## 2019-12-08 DIAGNOSIS — Z9181 History of falling: Secondary | ICD-10-CM | POA: Diagnosis not present

## 2019-12-08 DIAGNOSIS — I10 Essential (primary) hypertension: Secondary | ICD-10-CM | POA: Diagnosis not present

## 2019-12-11 DIAGNOSIS — J45909 Unspecified asthma, uncomplicated: Secondary | ICD-10-CM | POA: Diagnosis not present

## 2019-12-11 DIAGNOSIS — M21372 Foot drop, left foot: Secondary | ICD-10-CM | POA: Diagnosis not present

## 2019-12-11 DIAGNOSIS — E041 Nontoxic single thyroid nodule: Secondary | ICD-10-CM | POA: Diagnosis not present

## 2019-12-11 DIAGNOSIS — M818 Other osteoporosis without current pathological fracture: Secondary | ICD-10-CM | POA: Diagnosis not present

## 2019-12-11 DIAGNOSIS — M419 Scoliosis, unspecified: Secondary | ICD-10-CM | POA: Diagnosis not present

## 2019-12-11 DIAGNOSIS — I10 Essential (primary) hypertension: Secondary | ICD-10-CM | POA: Diagnosis not present

## 2019-12-14 DIAGNOSIS — E041 Nontoxic single thyroid nodule: Secondary | ICD-10-CM | POA: Diagnosis not present

## 2019-12-14 DIAGNOSIS — M818 Other osteoporosis without current pathological fracture: Secondary | ICD-10-CM | POA: Diagnosis not present

## 2019-12-14 DIAGNOSIS — M419 Scoliosis, unspecified: Secondary | ICD-10-CM | POA: Diagnosis not present

## 2019-12-14 DIAGNOSIS — J45909 Unspecified asthma, uncomplicated: Secondary | ICD-10-CM | POA: Diagnosis not present

## 2019-12-14 DIAGNOSIS — M21372 Foot drop, left foot: Secondary | ICD-10-CM | POA: Diagnosis not present

## 2019-12-14 DIAGNOSIS — I10 Essential (primary) hypertension: Secondary | ICD-10-CM | POA: Diagnosis not present

## 2019-12-19 DIAGNOSIS — I10 Essential (primary) hypertension: Secondary | ICD-10-CM | POA: Diagnosis not present

## 2019-12-19 DIAGNOSIS — M419 Scoliosis, unspecified: Secondary | ICD-10-CM | POA: Diagnosis not present

## 2019-12-19 DIAGNOSIS — J45909 Unspecified asthma, uncomplicated: Secondary | ICD-10-CM | POA: Diagnosis not present

## 2019-12-19 DIAGNOSIS — M818 Other osteoporosis without current pathological fracture: Secondary | ICD-10-CM | POA: Diagnosis not present

## 2019-12-19 DIAGNOSIS — M21372 Foot drop, left foot: Secondary | ICD-10-CM | POA: Diagnosis not present

## 2019-12-19 DIAGNOSIS — E041 Nontoxic single thyroid nodule: Secondary | ICD-10-CM | POA: Diagnosis not present

## 2019-12-21 DIAGNOSIS — I10 Essential (primary) hypertension: Secondary | ICD-10-CM | POA: Diagnosis not present

## 2019-12-21 DIAGNOSIS — M21372 Foot drop, left foot: Secondary | ICD-10-CM | POA: Diagnosis not present

## 2019-12-21 DIAGNOSIS — M818 Other osteoporosis without current pathological fracture: Secondary | ICD-10-CM | POA: Diagnosis not present

## 2019-12-21 DIAGNOSIS — E041 Nontoxic single thyroid nodule: Secondary | ICD-10-CM | POA: Diagnosis not present

## 2019-12-21 DIAGNOSIS — J45909 Unspecified asthma, uncomplicated: Secondary | ICD-10-CM | POA: Diagnosis not present

## 2019-12-21 DIAGNOSIS — M419 Scoliosis, unspecified: Secondary | ICD-10-CM | POA: Diagnosis not present

## 2019-12-24 DIAGNOSIS — I10 Essential (primary) hypertension: Secondary | ICD-10-CM | POA: Diagnosis not present

## 2019-12-24 DIAGNOSIS — M818 Other osteoporosis without current pathological fracture: Secondary | ICD-10-CM | POA: Diagnosis not present

## 2019-12-24 DIAGNOSIS — E041 Nontoxic single thyroid nodule: Secondary | ICD-10-CM | POA: Diagnosis not present

## 2019-12-24 DIAGNOSIS — M419 Scoliosis, unspecified: Secondary | ICD-10-CM | POA: Diagnosis not present

## 2019-12-24 DIAGNOSIS — M21372 Foot drop, left foot: Secondary | ICD-10-CM | POA: Diagnosis not present

## 2019-12-24 DIAGNOSIS — J45909 Unspecified asthma, uncomplicated: Secondary | ICD-10-CM | POA: Diagnosis not present

## 2019-12-26 DIAGNOSIS — M21372 Foot drop, left foot: Secondary | ICD-10-CM | POA: Diagnosis not present

## 2019-12-26 DIAGNOSIS — M818 Other osteoporosis without current pathological fracture: Secondary | ICD-10-CM | POA: Diagnosis not present

## 2019-12-26 DIAGNOSIS — I10 Essential (primary) hypertension: Secondary | ICD-10-CM | POA: Diagnosis not present

## 2019-12-26 DIAGNOSIS — J45909 Unspecified asthma, uncomplicated: Secondary | ICD-10-CM | POA: Diagnosis not present

## 2019-12-26 DIAGNOSIS — E041 Nontoxic single thyroid nodule: Secondary | ICD-10-CM | POA: Diagnosis not present

## 2019-12-26 DIAGNOSIS — M419 Scoliosis, unspecified: Secondary | ICD-10-CM | POA: Diagnosis not present

## 2020-01-04 DIAGNOSIS — M546 Pain in thoracic spine: Secondary | ICD-10-CM | POA: Diagnosis not present

## 2020-01-07 DIAGNOSIS — M21372 Foot drop, left foot: Secondary | ICD-10-CM | POA: Diagnosis not present

## 2020-01-07 DIAGNOSIS — E041 Nontoxic single thyroid nodule: Secondary | ICD-10-CM | POA: Diagnosis not present

## 2020-01-07 DIAGNOSIS — Z87891 Personal history of nicotine dependence: Secondary | ICD-10-CM | POA: Diagnosis not present

## 2020-01-07 DIAGNOSIS — E782 Mixed hyperlipidemia: Secondary | ICD-10-CM | POA: Diagnosis not present

## 2020-01-07 DIAGNOSIS — E7800 Pure hypercholesterolemia, unspecified: Secondary | ICD-10-CM | POA: Diagnosis not present

## 2020-01-07 DIAGNOSIS — I10 Essential (primary) hypertension: Secondary | ICD-10-CM | POA: Diagnosis not present

## 2020-01-07 DIAGNOSIS — Z7982 Long term (current) use of aspirin: Secondary | ICD-10-CM | POA: Diagnosis not present

## 2020-01-07 DIAGNOSIS — M419 Scoliosis, unspecified: Secondary | ICD-10-CM | POA: Diagnosis not present

## 2020-01-07 DIAGNOSIS — M1990 Unspecified osteoarthritis, unspecified site: Secondary | ICD-10-CM | POA: Diagnosis not present

## 2020-01-07 DIAGNOSIS — Z9181 History of falling: Secondary | ICD-10-CM | POA: Diagnosis not present

## 2020-01-07 DIAGNOSIS — M818 Other osteoporosis without current pathological fracture: Secondary | ICD-10-CM | POA: Diagnosis not present

## 2020-01-07 DIAGNOSIS — J45909 Unspecified asthma, uncomplicated: Secondary | ICD-10-CM | POA: Diagnosis not present

## 2020-01-09 DIAGNOSIS — J45909 Unspecified asthma, uncomplicated: Secondary | ICD-10-CM | POA: Diagnosis not present

## 2020-01-09 DIAGNOSIS — M818 Other osteoporosis without current pathological fracture: Secondary | ICD-10-CM | POA: Diagnosis not present

## 2020-01-09 DIAGNOSIS — M419 Scoliosis, unspecified: Secondary | ICD-10-CM | POA: Diagnosis not present

## 2020-01-09 DIAGNOSIS — E041 Nontoxic single thyroid nodule: Secondary | ICD-10-CM | POA: Diagnosis not present

## 2020-01-09 DIAGNOSIS — I10 Essential (primary) hypertension: Secondary | ICD-10-CM | POA: Diagnosis not present

## 2020-01-09 DIAGNOSIS — M21372 Foot drop, left foot: Secondary | ICD-10-CM | POA: Diagnosis not present

## 2020-01-14 DIAGNOSIS — R0789 Other chest pain: Secondary | ICD-10-CM | POA: Diagnosis not present

## 2020-01-14 DIAGNOSIS — R0600 Dyspnea, unspecified: Secondary | ICD-10-CM | POA: Diagnosis not present

## 2020-01-14 DIAGNOSIS — Z20828 Contact with and (suspected) exposure to other viral communicable diseases: Secondary | ICD-10-CM | POA: Diagnosis not present

## 2020-01-22 DIAGNOSIS — M21372 Foot drop, left foot: Secondary | ICD-10-CM | POA: Diagnosis not present

## 2020-01-22 DIAGNOSIS — I10 Essential (primary) hypertension: Secondary | ICD-10-CM | POA: Diagnosis not present

## 2020-01-22 DIAGNOSIS — J45909 Unspecified asthma, uncomplicated: Secondary | ICD-10-CM | POA: Diagnosis not present

## 2020-01-22 DIAGNOSIS — E041 Nontoxic single thyroid nodule: Secondary | ICD-10-CM | POA: Diagnosis not present

## 2020-01-22 DIAGNOSIS — M419 Scoliosis, unspecified: Secondary | ICD-10-CM | POA: Diagnosis not present

## 2020-01-22 DIAGNOSIS — M818 Other osteoporosis without current pathological fracture: Secondary | ICD-10-CM | POA: Diagnosis not present

## 2020-01-29 DIAGNOSIS — Z23 Encounter for immunization: Secondary | ICD-10-CM | POA: Diagnosis not present

## 2020-02-11 DIAGNOSIS — Z86010 Personal history of colonic polyps: Secondary | ICD-10-CM | POA: Diagnosis not present

## 2020-02-11 DIAGNOSIS — Z1211 Encounter for screening for malignant neoplasm of colon: Secondary | ICD-10-CM | POA: Diagnosis not present

## 2020-02-29 DIAGNOSIS — M818 Other osteoporosis without current pathological fracture: Secondary | ICD-10-CM | POA: Diagnosis not present

## 2020-02-29 DIAGNOSIS — Z6821 Body mass index (BMI) 21.0-21.9, adult: Secondary | ICD-10-CM | POA: Diagnosis not present

## 2020-02-29 DIAGNOSIS — Z1389 Encounter for screening for other disorder: Secondary | ICD-10-CM | POA: Diagnosis not present

## 2020-02-29 DIAGNOSIS — I10 Essential (primary) hypertension: Secondary | ICD-10-CM | POA: Diagnosis not present

## 2020-02-29 DIAGNOSIS — E782 Mixed hyperlipidemia: Secondary | ICD-10-CM | POA: Diagnosis not present

## 2020-02-29 DIAGNOSIS — Z0000 Encounter for general adult medical examination without abnormal findings: Secondary | ICD-10-CM | POA: Diagnosis not present

## 2020-05-04 DIAGNOSIS — J181 Lobar pneumonia, unspecified organism: Secondary | ICD-10-CM | POA: Diagnosis not present

## 2020-05-04 DIAGNOSIS — D491 Neoplasm of unspecified behavior of respiratory system: Secondary | ICD-10-CM | POA: Diagnosis not present

## 2020-05-04 DIAGNOSIS — J189 Pneumonia, unspecified organism: Secondary | ICD-10-CM | POA: Diagnosis not present

## 2020-05-04 DIAGNOSIS — R0602 Shortness of breath: Secondary | ICD-10-CM | POA: Diagnosis not present

## 2020-05-04 DIAGNOSIS — R079 Chest pain, unspecified: Secondary | ICD-10-CM | POA: Diagnosis not present

## 2020-05-04 DIAGNOSIS — J984 Other disorders of lung: Secondary | ICD-10-CM | POA: Diagnosis not present

## 2020-05-04 DIAGNOSIS — J8410 Pulmonary fibrosis, unspecified: Secondary | ICD-10-CM | POA: Diagnosis not present

## 2020-05-04 DIAGNOSIS — I313 Pericardial effusion (noninflammatory): Secondary | ICD-10-CM | POA: Diagnosis not present

## 2020-05-04 DIAGNOSIS — R0789 Other chest pain: Secondary | ICD-10-CM | POA: Diagnosis not present

## 2020-05-05 DIAGNOSIS — R918 Other nonspecific abnormal finding of lung field: Secondary | ICD-10-CM | POA: Diagnosis not present

## 2020-05-05 DIAGNOSIS — J189 Pneumonia, unspecified organism: Secondary | ICD-10-CM | POA: Diagnosis not present

## 2020-05-12 DIAGNOSIS — J189 Pneumonia, unspecified organism: Secondary | ICD-10-CM | POA: Diagnosis not present

## 2020-05-12 DIAGNOSIS — R059 Cough, unspecified: Secondary | ICD-10-CM | POA: Diagnosis not present

## 2020-05-12 DIAGNOSIS — R0602 Shortness of breath: Secondary | ICD-10-CM | POA: Diagnosis not present

## 2020-05-14 DIAGNOSIS — I700 Atherosclerosis of aorta: Secondary | ICD-10-CM | POA: Diagnosis not present

## 2020-05-14 DIAGNOSIS — R918 Other nonspecific abnormal finding of lung field: Secondary | ICD-10-CM | POA: Diagnosis not present

## 2020-05-14 DIAGNOSIS — J90 Pleural effusion, not elsewhere classified: Secondary | ICD-10-CM | POA: Diagnosis not present

## 2020-05-14 DIAGNOSIS — I2510 Atherosclerotic heart disease of native coronary artery without angina pectoris: Secondary | ICD-10-CM | POA: Diagnosis not present

## 2020-05-14 DIAGNOSIS — N400 Enlarged prostate without lower urinary tract symptoms: Secondary | ICD-10-CM | POA: Diagnosis not present

## 2020-05-20 DIAGNOSIS — R052 Subacute cough: Secondary | ICD-10-CM | POA: Diagnosis not present

## 2020-05-20 DIAGNOSIS — J869 Pyothorax without fistula: Secondary | ICD-10-CM | POA: Diagnosis not present

## 2020-05-20 DIAGNOSIS — R918 Other nonspecific abnormal finding of lung field: Secondary | ICD-10-CM | POA: Diagnosis not present

## 2020-05-27 DIAGNOSIS — I10 Essential (primary) hypertension: Secondary | ICD-10-CM | POA: Diagnosis not present

## 2020-05-27 DIAGNOSIS — J90 Pleural effusion, not elsewhere classified: Secondary | ICD-10-CM | POA: Diagnosis not present

## 2020-05-27 DIAGNOSIS — I4519 Other right bundle-branch block: Secondary | ICD-10-CM | POA: Diagnosis not present

## 2020-05-27 DIAGNOSIS — J189 Pneumonia, unspecified organism: Secondary | ICD-10-CM | POA: Diagnosis not present

## 2020-05-30 DIAGNOSIS — H6123 Impacted cerumen, bilateral: Secondary | ICD-10-CM | POA: Diagnosis not present

## 2020-05-30 DIAGNOSIS — H61303 Acquired stenosis of external ear canal, unspecified, bilateral: Secondary | ICD-10-CM | POA: Diagnosis not present

## 2020-05-30 DIAGNOSIS — Z974 Presence of external hearing-aid: Secondary | ICD-10-CM | POA: Diagnosis not present

## 2020-05-30 DIAGNOSIS — H9193 Unspecified hearing loss, bilateral: Secondary | ICD-10-CM | POA: Diagnosis not present

## 2020-06-03 DIAGNOSIS — I10 Essential (primary) hypertension: Secondary | ICD-10-CM | POA: Diagnosis not present

## 2020-06-03 DIAGNOSIS — J90 Pleural effusion, not elsewhere classified: Secondary | ICD-10-CM | POA: Diagnosis not present

## 2020-06-03 DIAGNOSIS — I4519 Other right bundle-branch block: Secondary | ICD-10-CM | POA: Diagnosis not present

## 2020-06-03 DIAGNOSIS — J189 Pneumonia, unspecified organism: Secondary | ICD-10-CM | POA: Diagnosis not present

## 2020-06-04 DIAGNOSIS — R972 Elevated prostate specific antigen [PSA]: Secondary | ICD-10-CM | POA: Diagnosis not present

## 2020-06-04 DIAGNOSIS — M810 Age-related osteoporosis without current pathological fracture: Secondary | ICD-10-CM | POA: Diagnosis not present

## 2020-06-04 DIAGNOSIS — E782 Mixed hyperlipidemia: Secondary | ICD-10-CM | POA: Diagnosis not present

## 2020-06-04 DIAGNOSIS — I10 Essential (primary) hypertension: Secondary | ICD-10-CM | POA: Diagnosis not present

## 2020-06-23 DIAGNOSIS — J9811 Atelectasis: Secondary | ICD-10-CM | POA: Diagnosis not present

## 2020-06-23 DIAGNOSIS — J90 Pleural effusion, not elsewhere classified: Secondary | ICD-10-CM | POA: Diagnosis not present

## 2020-06-23 DIAGNOSIS — R918 Other nonspecific abnormal finding of lung field: Secondary | ICD-10-CM | POA: Diagnosis not present

## 2020-06-23 DIAGNOSIS — I2510 Atherosclerotic heart disease of native coronary artery without angina pectoris: Secondary | ICD-10-CM | POA: Diagnosis not present

## 2020-06-23 DIAGNOSIS — I700 Atherosclerosis of aorta: Secondary | ICD-10-CM | POA: Diagnosis not present

## 2020-06-24 DIAGNOSIS — M810 Age-related osteoporosis without current pathological fracture: Secondary | ICD-10-CM | POA: Diagnosis not present

## 2020-07-01 DIAGNOSIS — R918 Other nonspecific abnormal finding of lung field: Secondary | ICD-10-CM | POA: Diagnosis not present

## 2020-09-03 DIAGNOSIS — I10 Essential (primary) hypertension: Secondary | ICD-10-CM | POA: Diagnosis not present

## 2020-09-03 DIAGNOSIS — R972 Elevated prostate specific antigen [PSA]: Secondary | ICD-10-CM | POA: Diagnosis not present

## 2020-09-03 DIAGNOSIS — E782 Mixed hyperlipidemia: Secondary | ICD-10-CM | POA: Diagnosis not present

## 2020-11-04 ENCOUNTER — Other Ambulatory Visit: Payer: Self-pay | Admitting: Urology

## 2020-11-04 DIAGNOSIS — R972 Elevated prostate specific antigen [PSA]: Secondary | ICD-10-CM | POA: Diagnosis not present

## 2020-11-04 DIAGNOSIS — Z125 Encounter for screening for malignant neoplasm of prostate: Secondary | ICD-10-CM

## 2020-11-04 DIAGNOSIS — R351 Nocturia: Secondary | ICD-10-CM | POA: Diagnosis not present

## 2020-11-04 DIAGNOSIS — N402 Nodular prostate without lower urinary tract symptoms: Secondary | ICD-10-CM

## 2020-11-04 DIAGNOSIS — N403 Nodular prostate with lower urinary tract symptoms: Secondary | ICD-10-CM | POA: Diagnosis not present

## 2020-11-04 DIAGNOSIS — R3912 Poor urinary stream: Secondary | ICD-10-CM | POA: Diagnosis not present

## 2020-11-21 DIAGNOSIS — L570 Actinic keratosis: Secondary | ICD-10-CM | POA: Diagnosis not present

## 2020-11-29 ENCOUNTER — Other Ambulatory Visit: Payer: Self-pay

## 2020-11-29 ENCOUNTER — Ambulatory Visit
Admission: RE | Admit: 2020-11-29 | Discharge: 2020-11-29 | Disposition: A | Discharge status: Home / Self Care | Payer: Medicare Other | Source: Ambulatory Visit | Attending: Urology | Admitting: Urology

## 2020-11-29 DIAGNOSIS — N402 Nodular prostate without lower urinary tract symptoms: Secondary | ICD-10-CM

## 2020-11-29 DIAGNOSIS — Z125 Encounter for screening for malignant neoplasm of prostate: Secondary | ICD-10-CM

## 2020-11-29 DIAGNOSIS — R972 Elevated prostate specific antigen [PSA]: Secondary | ICD-10-CM | POA: Diagnosis not present

## 2020-11-29 MED ORDER — GADOBENATE DIMEGLUMINE 529 MG/ML IV SOLN
14.0000 mL | Freq: Once | INTRAVENOUS | Status: AC | PRN
  Administered 2020-11-29: 14 mL via INTRAVENOUS

## 2020-12-05 DIAGNOSIS — E782 Mixed hyperlipidemia: Secondary | ICD-10-CM | POA: Diagnosis not present

## 2020-12-05 DIAGNOSIS — I10 Essential (primary) hypertension: Secondary | ICD-10-CM | POA: Diagnosis not present

## 2020-12-05 DIAGNOSIS — R972 Elevated prostate specific antigen [PSA]: Secondary | ICD-10-CM | POA: Diagnosis not present

## 2020-12-09 DIAGNOSIS — N403 Nodular prostate with lower urinary tract symptoms: Secondary | ICD-10-CM | POA: Diagnosis not present

## 2020-12-09 DIAGNOSIS — R351 Nocturia: Secondary | ICD-10-CM | POA: Diagnosis not present

## 2020-12-09 DIAGNOSIS — R972 Elevated prostate specific antigen [PSA]: Secondary | ICD-10-CM | POA: Diagnosis not present

## 2020-12-22 DIAGNOSIS — R911 Solitary pulmonary nodule: Secondary | ICD-10-CM | POA: Diagnosis not present

## 2020-12-22 DIAGNOSIS — I700 Atherosclerosis of aorta: Secondary | ICD-10-CM | POA: Diagnosis not present

## 2020-12-22 DIAGNOSIS — R918 Other nonspecific abnormal finding of lung field: Secondary | ICD-10-CM | POA: Diagnosis not present

## 2020-12-29 DIAGNOSIS — Z23 Encounter for immunization: Secondary | ICD-10-CM | POA: Diagnosis not present

## 2021-01-02 DIAGNOSIS — R918 Other nonspecific abnormal finding of lung field: Secondary | ICD-10-CM | POA: Diagnosis not present

## 2021-02-12 DIAGNOSIS — L601 Onycholysis: Secondary | ICD-10-CM | POA: Diagnosis not present

## 2021-03-09 DIAGNOSIS — R233 Spontaneous ecchymoses: Secondary | ICD-10-CM | POA: Diagnosis not present

## 2021-03-10 DIAGNOSIS — Z1331 Encounter for screening for depression: Secondary | ICD-10-CM | POA: Diagnosis not present

## 2021-03-10 DIAGNOSIS — I10 Essential (primary) hypertension: Secondary | ICD-10-CM | POA: Diagnosis not present

## 2021-03-10 DIAGNOSIS — E782 Mixed hyperlipidemia: Secondary | ICD-10-CM | POA: Diagnosis not present

## 2021-03-16 DIAGNOSIS — E782 Mixed hyperlipidemia: Secondary | ICD-10-CM | POA: Diagnosis not present

## 2021-03-16 DIAGNOSIS — I10 Essential (primary) hypertension: Secondary | ICD-10-CM | POA: Diagnosis not present

## 2021-03-16 DIAGNOSIS — Z6820 Body mass index (BMI) 20.0-20.9, adult: Secondary | ICD-10-CM | POA: Diagnosis not present

## 2021-03-16 DIAGNOSIS — Z0000 Encounter for general adult medical examination without abnormal findings: Secondary | ICD-10-CM | POA: Diagnosis not present

## 2021-03-16 DIAGNOSIS — R911 Solitary pulmonary nodule: Secondary | ICD-10-CM | POA: Diagnosis not present

## 2021-03-16 DIAGNOSIS — F419 Anxiety disorder, unspecified: Secondary | ICD-10-CM | POA: Diagnosis not present

## 2021-03-16 DIAGNOSIS — M810 Age-related osteoporosis without current pathological fracture: Secondary | ICD-10-CM | POA: Diagnosis not present

## 2021-03-30 DIAGNOSIS — H903 Sensorineural hearing loss, bilateral: Secondary | ICD-10-CM | POA: Diagnosis not present

## 2021-06-01 DIAGNOSIS — R972 Elevated prostate specific antigen [PSA]: Secondary | ICD-10-CM | POA: Diagnosis not present

## 2021-06-12 DIAGNOSIS — R3912 Poor urinary stream: Secondary | ICD-10-CM | POA: Diagnosis not present

## 2021-06-12 DIAGNOSIS — N401 Enlarged prostate with lower urinary tract symptoms: Secondary | ICD-10-CM | POA: Diagnosis not present

## 2021-06-12 DIAGNOSIS — R972 Elevated prostate specific antigen [PSA]: Secondary | ICD-10-CM | POA: Diagnosis not present

## 2021-06-12 DIAGNOSIS — N403 Nodular prostate with lower urinary tract symptoms: Secondary | ICD-10-CM | POA: Diagnosis not present

## 2021-06-15 DIAGNOSIS — R972 Elevated prostate specific antigen [PSA]: Secondary | ICD-10-CM | POA: Diagnosis not present

## 2021-06-15 DIAGNOSIS — E782 Mixed hyperlipidemia: Secondary | ICD-10-CM | POA: Diagnosis not present

## 2021-06-15 DIAGNOSIS — M810 Age-related osteoporosis without current pathological fracture: Secondary | ICD-10-CM | POA: Diagnosis not present

## 2021-06-15 DIAGNOSIS — F3342 Major depressive disorder, recurrent, in full remission: Secondary | ICD-10-CM | POA: Diagnosis not present

## 2021-06-15 DIAGNOSIS — F05 Delirium due to known physiological condition: Secondary | ICD-10-CM | POA: Diagnosis not present

## 2021-06-15 DIAGNOSIS — I10 Essential (primary) hypertension: Secondary | ICD-10-CM | POA: Diagnosis not present

## 2021-06-15 DIAGNOSIS — Z131 Encounter for screening for diabetes mellitus: Secondary | ICD-10-CM | POA: Diagnosis not present

## 2021-07-29 DIAGNOSIS — R972 Elevated prostate specific antigen [PSA]: Secondary | ICD-10-CM | POA: Diagnosis not present

## 2021-09-16 DIAGNOSIS — I10 Essential (primary) hypertension: Secondary | ICD-10-CM | POA: Diagnosis not present

## 2021-09-16 DIAGNOSIS — F3342 Major depressive disorder, recurrent, in full remission: Secondary | ICD-10-CM | POA: Diagnosis not present

## 2021-09-16 DIAGNOSIS — M810 Age-related osteoporosis without current pathological fracture: Secondary | ICD-10-CM | POA: Diagnosis not present

## 2021-09-16 DIAGNOSIS — M545 Low back pain, unspecified: Secondary | ICD-10-CM | POA: Diagnosis not present

## 2021-09-16 DIAGNOSIS — R972 Elevated prostate specific antigen [PSA]: Secondary | ICD-10-CM | POA: Diagnosis not present

## 2021-09-16 DIAGNOSIS — E782 Mixed hyperlipidemia: Secondary | ICD-10-CM | POA: Diagnosis not present

## 2021-09-16 DIAGNOSIS — G8929 Other chronic pain: Secondary | ICD-10-CM | POA: Diagnosis not present

## 2021-12-02 DIAGNOSIS — M810 Age-related osteoporosis without current pathological fracture: Secondary | ICD-10-CM | POA: Diagnosis not present

## 2021-12-21 DIAGNOSIS — R972 Elevated prostate specific antigen [PSA]: Secondary | ICD-10-CM | POA: Diagnosis not present

## 2021-12-23 DIAGNOSIS — R2681 Unsteadiness on feet: Secondary | ICD-10-CM | POA: Diagnosis not present

## 2021-12-23 DIAGNOSIS — M818 Other osteoporosis without current pathological fracture: Secondary | ICD-10-CM | POA: Diagnosis not present

## 2021-12-23 DIAGNOSIS — I10 Essential (primary) hypertension: Secondary | ICD-10-CM | POA: Diagnosis not present

## 2021-12-23 DIAGNOSIS — E782 Mixed hyperlipidemia: Secondary | ICD-10-CM | POA: Diagnosis not present

## 2022-01-01 DIAGNOSIS — R918 Other nonspecific abnormal finding of lung field: Secondary | ICD-10-CM | POA: Diagnosis not present

## 2022-01-15 DIAGNOSIS — R2689 Other abnormalities of gait and mobility: Secondary | ICD-10-CM | POA: Diagnosis not present

## 2022-01-25 DIAGNOSIS — R2689 Other abnormalities of gait and mobility: Secondary | ICD-10-CM | POA: Diagnosis not present

## 2022-01-27 DIAGNOSIS — R2689 Other abnormalities of gait and mobility: Secondary | ICD-10-CM | POA: Diagnosis not present

## 2022-02-01 DIAGNOSIS — R2689 Other abnormalities of gait and mobility: Secondary | ICD-10-CM | POA: Diagnosis not present

## 2022-02-02 DIAGNOSIS — R918 Other nonspecific abnormal finding of lung field: Secondary | ICD-10-CM | POA: Diagnosis not present

## 2022-02-03 DIAGNOSIS — R2689 Other abnormalities of gait and mobility: Secondary | ICD-10-CM | POA: Diagnosis not present

## 2022-02-08 DIAGNOSIS — R2689 Other abnormalities of gait and mobility: Secondary | ICD-10-CM | POA: Diagnosis not present

## 2022-02-08 DIAGNOSIS — R918 Other nonspecific abnormal finding of lung field: Secondary | ICD-10-CM | POA: Diagnosis not present

## 2022-02-10 DIAGNOSIS — R2689 Other abnormalities of gait and mobility: Secondary | ICD-10-CM | POA: Diagnosis not present

## 2022-02-15 DIAGNOSIS — R2689 Other abnormalities of gait and mobility: Secondary | ICD-10-CM | POA: Diagnosis not present

## 2022-02-17 DIAGNOSIS — R2689 Other abnormalities of gait and mobility: Secondary | ICD-10-CM | POA: Diagnosis not present

## 2022-02-22 DIAGNOSIS — R2689 Other abnormalities of gait and mobility: Secondary | ICD-10-CM | POA: Diagnosis not present

## 2022-02-24 DIAGNOSIS — R2689 Other abnormalities of gait and mobility: Secondary | ICD-10-CM | POA: Diagnosis not present

## 2022-03-03 DIAGNOSIS — R2689 Other abnormalities of gait and mobility: Secondary | ICD-10-CM | POA: Diagnosis not present

## 2022-03-08 DIAGNOSIS — R2689 Other abnormalities of gait and mobility: Secondary | ICD-10-CM | POA: Diagnosis not present

## 2022-03-10 DIAGNOSIS — R2689 Other abnormalities of gait and mobility: Secondary | ICD-10-CM | POA: Diagnosis not present

## 2022-03-16 DIAGNOSIS — R2689 Other abnormalities of gait and mobility: Secondary | ICD-10-CM | POA: Diagnosis not present

## 2022-03-18 ENCOUNTER — Other Ambulatory Visit: Payer: Self-pay

## 2022-03-18 ENCOUNTER — Telehealth: Payer: Self-pay

## 2022-03-18 MED ORDER — SERTRALINE HCL 50 MG PO TABS: 25.0000 mg | ORAL_TABLET | Freq: Every day | ORAL | 0 refills | Status: DC

## 2022-03-18 MED ORDER — AMLODIPINE BESYLATE 2.5 MG PO TABS: 2.5000 mg | ORAL_TABLET | Freq: Every day | ORAL | 0 refills | Status: DC

## 2022-03-18 NOTE — Telephone Encounter (Signed)
Med Refill

## 2022-03-19 DIAGNOSIS — R2689 Other abnormalities of gait and mobility: Secondary | ICD-10-CM | POA: Diagnosis not present

## 2022-03-23 DIAGNOSIS — R2689 Other abnormalities of gait and mobility: Secondary | ICD-10-CM | POA: Diagnosis not present

## 2022-03-26 ENCOUNTER — Ambulatory Visit: Payer: Medicare Other | Admitting: Internal Medicine

## 2022-03-26 ENCOUNTER — Encounter: Payer: Self-pay | Admitting: Internal Medicine

## 2022-03-26 VITALS — BP 128/80 | HR 62 | Temp 97.7°F | Resp 18 | Ht 72.0 in | Wt 158.5 lb

## 2022-03-26 DIAGNOSIS — E785 Hyperlipidemia, unspecified: Secondary | ICD-10-CM | POA: Diagnosis not present

## 2022-03-26 DIAGNOSIS — M810 Age-related osteoporosis without current pathological fracture: Secondary | ICD-10-CM | POA: Insufficient documentation

## 2022-03-26 DIAGNOSIS — M8000XA Age-related osteoporosis with current pathological fracture, unspecified site, initial encounter for fracture: Secondary | ICD-10-CM | POA: Diagnosis not present

## 2022-03-26 DIAGNOSIS — F3342 Major depressive disorder, recurrent, in full remission: Secondary | ICD-10-CM | POA: Diagnosis not present

## 2022-03-26 DIAGNOSIS — I10 Essential (primary) hypertension: Secondary | ICD-10-CM | POA: Diagnosis not present

## 2022-03-26 NOTE — Progress Notes (Signed)
Office Visit  Subjective   Patient ID: Andrew Poole   DOB: [birth year]   Age: 86 y.o.   MRN: [id number]   Chief Complaint Chief Complaint  Patient presents with   Follow-up    3 month     History of Present Illness The patient is a 86 year old Caucasian/White male who presents for a FOLLOW-UP. He is looking better, he does not look depress even though his wife is still going through recurrence of ovarian cancer. He think zoloft and aripeprazole has made a difference on him. He has hypertension. The patient has not been checking his blood pressure at home. The patient's current medications include: amlodipine 2.5 mg tablet. The patient has been tolerating his medications well. The patient denies any chest pain, shortness of breath, orthopnea, and PND.  He reports there have been no other symptoms noted.  Andrew Poole returns today for routine followup on his cholesterol. Overall, he states he is doing well and is without any complaints or problems at this time. He specifically denies chest pain, abdominal pain, nausea, diarrhea, and myalgias. He remains on dietary management as well as the following cholesterol lowering medications atorvastatin 10 mg tablet. He last labs done in 3/23.  He currently lives with his wife. He has no significant prior history of mental health disorders.      He has osteoporosis, he get infusion at [facility] 6 months ago.   He says his back pain and gait is    He got flu shot this byear.     Past Medical History No past medical history on file.   Allergies No Known Allergies   Review of Systems Review of Systems  Constitutional: Negative.   HENT: Negative.    Respiratory: Negative.    Cardiovascular: Negative.   Gastrointestinal: Negative.   Neurological: Negative.        Objective:    Vitals BP 128/80 (BP Location: Left Arm, Patient Position: Sitting, Cuff Size: Normal)   Pulse 62   Temp 97.7 F (36.5 C)   Resp 18   Ht 6'  (1.829 m)   Wt 158 lb 8 oz (71.9 kg)   SpO2 99%   BMI 21.50 kg/m    Physical Examination Physical Exam Constitutional:      Appearance: Normal appearance. He is normal weight.  HENT:     Head: Normocephalic and atraumatic.  Eyes:     Extraocular Movements: Extraocular movements intact.     Pupils: Pupils are equal, round, and reactive to light.  Cardiovascular:     Rate and Rhythm: Normal rate and regular rhythm.     Heart sounds: Normal heart sounds.  Pulmonary:     Effort: Pulmonary effort is normal.     Breath sounds: Normal breath sounds.  Abdominal:     General: Bowel sounds are normal.     Palpations: Abdomen is soft.  Neurological:     General: No focal deficit present.     Mental Status: He is alert and oriented to person, place, and time.        Assessment & Plan:   Hypertension Controlled.  Osteoporosis Continue with prolia infusion every six month at [facility].  Dyslipidemia LDL is target control. Continue with atorvastatin 10 mg daily.  Recurrent major depressive disorder, in full remission ([place]) His depression is in remission with zoloft 50 mg and aripeprazole 2 mg daily.     Return in about 3 months (around 06/25/2022).   Garwin Brothers, MD

## 2022-03-26 NOTE — Assessment & Plan Note (Signed)
His depression is in remission with zoloft 50 mg and aripeprazole 2 mg daily.

## 2022-03-26 NOTE — Patient Instructions (Signed)
He will walk and continue

## 2022-03-26 NOTE — Assessment & Plan Note (Signed)
Continue with prolia infusion every six month at [facility].

## 2022-03-26 NOTE — Assessment & Plan Note (Signed)
Controlled.  

## 2022-03-26 NOTE — Assessment & Plan Note (Signed)
LDL is target control. Continue with atorvastatin 10 mg daily.

## 2022-03-30 DIAGNOSIS — R2689 Other abnormalities of gait and mobility: Secondary | ICD-10-CM | POA: Diagnosis not present

## 2022-04-09 DIAGNOSIS — R2689 Other abnormalities of gait and mobility: Secondary | ICD-10-CM | POA: Diagnosis not present

## 2022-04-11 DIAGNOSIS — Z87891 Personal history of nicotine dependence: Secondary | ICD-10-CM | POA: Diagnosis not present

## 2022-04-11 DIAGNOSIS — R55 Syncope and collapse: Secondary | ICD-10-CM | POA: Diagnosis not present

## 2022-04-11 DIAGNOSIS — Z7982 Long term (current) use of aspirin: Secondary | ICD-10-CM | POA: Diagnosis not present

## 2022-04-11 DIAGNOSIS — J180 Bronchopneumonia, unspecified organism: Secondary | ICD-10-CM | POA: Diagnosis not present

## 2022-04-11 DIAGNOSIS — N281 Cyst of kidney, acquired: Secondary | ICD-10-CM | POA: Diagnosis not present

## 2022-04-11 DIAGNOSIS — R531 Weakness: Secondary | ICD-10-CM | POA: Diagnosis not present

## 2022-04-11 DIAGNOSIS — G459 Transient cerebral ischemic attack, unspecified: Secondary | ICD-10-CM | POA: Diagnosis not present

## 2022-04-11 DIAGNOSIS — I444 Left anterior fascicular block: Secondary | ICD-10-CM | POA: Diagnosis not present

## 2022-04-11 DIAGNOSIS — R059 Cough, unspecified: Secondary | ICD-10-CM | POA: Diagnosis not present

## 2022-04-11 DIAGNOSIS — I10 Essential (primary) hypertension: Secondary | ICD-10-CM | POA: Diagnosis not present

## 2022-04-11 DIAGNOSIS — R3121 Asymptomatic microscopic hematuria: Secondary | ICD-10-CM | POA: Diagnosis not present

## 2022-04-11 DIAGNOSIS — I4510 Unspecified right bundle-branch block: Secondary | ICD-10-CM | POA: Diagnosis not present

## 2022-04-11 DIAGNOSIS — I959 Hypotension, unspecified: Secondary | ICD-10-CM | POA: Diagnosis not present

## 2022-04-11 DIAGNOSIS — Z1152 Encounter for screening for COVID-19: Secondary | ICD-10-CM | POA: Diagnosis not present

## 2022-04-11 DIAGNOSIS — Z8701 Personal history of pneumonia (recurrent): Secondary | ICD-10-CM | POA: Diagnosis not present

## 2022-04-11 DIAGNOSIS — Z79899 Other long term (current) drug therapy: Secondary | ICD-10-CM | POA: Diagnosis not present

## 2022-04-11 DIAGNOSIS — M47812 Spondylosis without myelopathy or radiculopathy, cervical region: Secondary | ICD-10-CM | POA: Diagnosis not present

## 2022-04-11 DIAGNOSIS — E860 Dehydration: Secondary | ICD-10-CM | POA: Diagnosis not present

## 2022-04-11 DIAGNOSIS — R5381 Other malaise: Secondary | ICD-10-CM | POA: Diagnosis not present

## 2022-04-11 DIAGNOSIS — K7460 Unspecified cirrhosis of liver: Secondary | ICD-10-CM | POA: Diagnosis not present

## 2022-04-11 DIAGNOSIS — R110 Nausea: Secondary | ICD-10-CM | POA: Diagnosis not present

## 2022-04-11 DIAGNOSIS — M1990 Unspecified osteoarthritis, unspecified site: Secondary | ICD-10-CM | POA: Diagnosis not present

## 2022-04-11 DIAGNOSIS — M4312 Spondylolisthesis, cervical region: Secondary | ICD-10-CM | POA: Diagnosis not present

## 2022-04-11 DIAGNOSIS — R112 Nausea with vomiting, unspecified: Secondary | ICD-10-CM | POA: Diagnosis not present

## 2022-04-11 DIAGNOSIS — R509 Fever, unspecified: Secondary | ICD-10-CM | POA: Diagnosis not present

## 2022-04-11 DIAGNOSIS — Z8719 Personal history of other diseases of the digestive system: Secondary | ICD-10-CM | POA: Diagnosis not present

## 2022-04-11 DIAGNOSIS — E876 Hypokalemia: Secondary | ICD-10-CM | POA: Diagnosis not present

## 2022-04-11 DIAGNOSIS — G9389 Other specified disorders of brain: Secondary | ICD-10-CM | POA: Diagnosis not present

## 2022-04-11 DIAGNOSIS — R9431 Abnormal electrocardiogram [ECG] [EKG]: Secondary | ICD-10-CM | POA: Diagnosis not present

## 2022-04-11 DIAGNOSIS — J9601 Acute respiratory failure with hypoxia: Secondary | ICD-10-CM | POA: Diagnosis not present

## 2022-04-11 DIAGNOSIS — R000 Tachycardia, unspecified: Secondary | ICD-10-CM | POA: Diagnosis not present

## 2022-04-11 DIAGNOSIS — R3129 Other microscopic hematuria: Secondary | ICD-10-CM | POA: Diagnosis not present

## 2022-04-15 DIAGNOSIS — M810 Age-related osteoporosis without current pathological fracture: Secondary | ICD-10-CM | POA: Diagnosis not present

## 2022-04-15 DIAGNOSIS — Z87891 Personal history of nicotine dependence: Secondary | ICD-10-CM | POA: Diagnosis not present

## 2022-04-15 DIAGNOSIS — Z79899 Other long term (current) drug therapy: Secondary | ICD-10-CM | POA: Diagnosis not present

## 2022-04-15 DIAGNOSIS — I10 Essential (primary) hypertension: Secondary | ICD-10-CM | POA: Diagnosis not present

## 2022-04-15 DIAGNOSIS — E876 Hypokalemia: Secondary | ICD-10-CM | POA: Diagnosis not present

## 2022-04-15 DIAGNOSIS — J9601 Acute respiratory failure with hypoxia: Secondary | ICD-10-CM | POA: Diagnosis not present

## 2022-04-15 DIAGNOSIS — Z9181 History of falling: Secondary | ICD-10-CM | POA: Diagnosis not present

## 2022-04-15 DIAGNOSIS — J180 Bronchopneumonia, unspecified organism: Secondary | ICD-10-CM | POA: Diagnosis not present

## 2022-04-15 DIAGNOSIS — Z791 Long term (current) use of non-steroidal anti-inflammatories (NSAID): Secondary | ICD-10-CM | POA: Diagnosis not present

## 2022-04-15 DIAGNOSIS — K7460 Unspecified cirrhosis of liver: Secondary | ICD-10-CM | POA: Diagnosis not present

## 2022-04-15 DIAGNOSIS — Z792 Long term (current) use of antibiotics: Secondary | ICD-10-CM | POA: Diagnosis not present

## 2022-04-15 DIAGNOSIS — N2889 Other specified disorders of kidney and ureter: Secondary | ICD-10-CM | POA: Diagnosis not present

## 2022-04-15 DIAGNOSIS — F3342 Major depressive disorder, recurrent, in full remission: Secondary | ICD-10-CM | POA: Diagnosis not present

## 2022-04-15 DIAGNOSIS — Z7982 Long term (current) use of aspirin: Secondary | ICD-10-CM | POA: Diagnosis not present

## 2022-04-15 DIAGNOSIS — F1090 Alcohol use, unspecified, uncomplicated: Secondary | ICD-10-CM | POA: Diagnosis not present

## 2022-04-15 DIAGNOSIS — M1990 Unspecified osteoarthritis, unspecified site: Secondary | ICD-10-CM | POA: Diagnosis not present

## 2022-04-15 DIAGNOSIS — E785 Hyperlipidemia, unspecified: Secondary | ICD-10-CM | POA: Diagnosis not present

## 2022-04-15 DIAGNOSIS — M47819 Spondylosis without myelopathy or radiculopathy, site unspecified: Secondary | ICD-10-CM | POA: Diagnosis not present

## 2022-04-15 DIAGNOSIS — Z556 Problems related to health literacy: Secondary | ICD-10-CM | POA: Diagnosis not present

## 2022-04-19 ENCOUNTER — Ambulatory Visit: Payer: Medicare Other | Admitting: Internal Medicine

## 2022-04-19 ENCOUNTER — Encounter: Payer: Self-pay | Admitting: Internal Medicine

## 2022-04-19 VITALS — BP 130/70 | HR 65 | Temp 97.5°F | Resp 18 | Ht 72.0 in | Wt 155.1 lb

## 2022-04-19 DIAGNOSIS — K7460 Unspecified cirrhosis of liver: Secondary | ICD-10-CM | POA: Diagnosis not present

## 2022-04-19 DIAGNOSIS — M549 Dorsalgia, unspecified: Secondary | ICD-10-CM | POA: Insufficient documentation

## 2022-04-19 DIAGNOSIS — I10 Essential (primary) hypertension: Secondary | ICD-10-CM | POA: Diagnosis not present

## 2022-04-19 DIAGNOSIS — R262 Difficulty in walking, not elsewhere classified: Secondary | ICD-10-CM | POA: Insufficient documentation

## 2022-04-19 DIAGNOSIS — J9601 Acute respiratory failure with hypoxia: Secondary | ICD-10-CM | POA: Diagnosis not present

## 2022-04-19 DIAGNOSIS — R531 Weakness: Secondary | ICD-10-CM | POA: Insufficient documentation

## 2022-04-19 DIAGNOSIS — G8929 Other chronic pain: Secondary | ICD-10-CM

## 2022-04-19 DIAGNOSIS — J180 Bronchopneumonia, unspecified organism: Secondary | ICD-10-CM | POA: Diagnosis not present

## 2022-04-19 DIAGNOSIS — N281 Cyst of kidney, acquired: Secondary | ICD-10-CM | POA: Diagnosis not present

## 2022-04-19 DIAGNOSIS — M810 Age-related osteoporosis without current pathological fracture: Secondary | ICD-10-CM | POA: Diagnosis not present

## 2022-04-19 DIAGNOSIS — R634 Abnormal weight loss: Secondary | ICD-10-CM | POA: Insufficient documentation

## 2022-04-19 DIAGNOSIS — F3342 Major depressive disorder, recurrent, in full remission: Secondary | ICD-10-CM | POA: Diagnosis not present

## 2022-04-19 DIAGNOSIS — M545 Low back pain, unspecified: Secondary | ICD-10-CM | POA: Diagnosis not present

## 2022-04-19 LAB — POCT URINALYSIS DIPSTICK
Bilirubin, UA: NEGATIVE
Blood, UA: NEGATIVE
Glucose, UA: NEGATIVE
Ketones, UA: NEGATIVE
Leukocytes, UA: NEGATIVE
Nitrite, UA: NEGATIVE
Protein, UA: NEGATIVE
Spec Grav, UA: 1.015 (ref 1.010–1.025)
Urobilinogen, UA: 0.2 E.U./dL
pH, UA: 6.5 (ref 5.0–8.0)

## 2022-04-19 MED ORDER — MELOXICAM 7.5 MG PO TABS: 7.5000 mg | ORAL_TABLET | Freq: Every day | ORAL | 3 refills | Status: DC | PRN

## 2022-04-19 MED ORDER — TRAMADOL HCL 50 MG PO TABS: 50.0000 mg | ORAL_TABLET | Freq: Two times a day (BID) | ORAL | 1 refills | Status: DC | PRN

## 2022-04-19 NOTE — Assessment & Plan Note (Signed)
He will use ensure or boost twice a day.

## 2022-04-19 NOTE — Progress Notes (Signed)
   Office Visit  Subjective   Patient ID: Andrew Poole   DOB: [birth year]   Age: 87 y.o.   MRN: [id number]   Chief Complaint Chief Complaint  Patient presents with   [facility] follow up     History of Present Illness 87 years old male who is here for follow up from hospital discharge. He was admitted to hospital on 04/11/22, after a fall at home. He was treated for community acquired pneumonia and respiratory failure. He was discharged home on 04/13/22. He finished last dose of antibiotic today. He is very weak and it is difficult for him to get up from chair and has unsteady gait. Today PT will come to his house to evaluate.  He also has hematuria and CT scan abdomen showed hemorrhagic cyst and he was advised to follow up with [place].   He also is c/o back pain and leg weakness. He had back surgery few years ago.   He was also noted to have hematuria in the hospital and CT scan abdomen shows hemorrhagic cyst in the kidney and he was advised to have follow-up with me.  I repeated urine analysis in office that was negative for any hematuria.  Past Medical History No past medical history on file.   Allergies No Known Allergies   Review of Systems Review of Systems  Constitutional:  Positive for malaise/fatigue.  HENT: Negative.    Respiratory: Negative.    Cardiovascular: Negative.   Gastrointestinal: Negative.   Neurological:  Positive for weakness.  Psychiatric/Behavioral:  Negative for depression and suicidal ideas.        Objective:    Vitals BP 130/70 (BP Location: Left Arm, Patient Position: Sitting, Cuff Size: Normal)   Pulse 65   Temp (!) 97.5 F (36.4 C)   Resp 18   Ht 6' (1.829 m)   Wt 155 lb 2 oz (70.4 kg)   SpO2 92%   BMI 21.04 kg/m    Physical Examination Physical Exam HENT:     Head: Normocephalic and atraumatic.  Eyes:     Extraocular Movements: Extraocular movements intact.     Pupils: Pupils are equal, round, and reactive to light.   Cardiovascular:     Rate and Rhythm: Normal rate and regular rhythm.     Heart sounds: Normal heart sounds.  Pulmonary:     Effort: Pulmonary effort is normal.     Breath sounds: Normal breath sounds.  Abdominal:     General: Bowel sounds are normal.     Palpations: Abdomen is soft.  Neurological:     Mental Status: He is alert and oriented to person, place, and time.     Motor: Weakness present.        Assessment & Plan:   Weakness Will arrange for PT at home.  Ambulatory dysfunction He will get PT and use walker  Weight loss He will use ensure or boost twice a day.    No follow-ups on file.   Garwin Brothers, MD

## 2022-04-19 NOTE — Assessment & Plan Note (Signed)
He will get PT and use walker

## 2022-04-19 NOTE — Assessment & Plan Note (Signed)
Will arrange for PT at home.

## 2022-04-23 DIAGNOSIS — J9601 Acute respiratory failure with hypoxia: Secondary | ICD-10-CM | POA: Diagnosis not present

## 2022-04-23 DIAGNOSIS — K7460 Unspecified cirrhosis of liver: Secondary | ICD-10-CM | POA: Diagnosis not present

## 2022-04-23 DIAGNOSIS — M810 Age-related osteoporosis without current pathological fracture: Secondary | ICD-10-CM | POA: Diagnosis not present

## 2022-04-23 DIAGNOSIS — I10 Essential (primary) hypertension: Secondary | ICD-10-CM | POA: Diagnosis not present

## 2022-04-23 DIAGNOSIS — J180 Bronchopneumonia, unspecified organism: Secondary | ICD-10-CM | POA: Diagnosis not present

## 2022-04-23 DIAGNOSIS — F3342 Major depressive disorder, recurrent, in full remission: Secondary | ICD-10-CM | POA: Diagnosis not present

## 2022-04-26 DIAGNOSIS — F3342 Major depressive disorder, recurrent, in full remission: Secondary | ICD-10-CM | POA: Diagnosis not present

## 2022-04-26 DIAGNOSIS — J180 Bronchopneumonia, unspecified organism: Secondary | ICD-10-CM | POA: Diagnosis not present

## 2022-04-26 DIAGNOSIS — J9601 Acute respiratory failure with hypoxia: Secondary | ICD-10-CM | POA: Diagnosis not present

## 2022-04-26 DIAGNOSIS — K7460 Unspecified cirrhosis of liver: Secondary | ICD-10-CM | POA: Diagnosis not present

## 2022-04-26 DIAGNOSIS — M810 Age-related osteoporosis without current pathological fracture: Secondary | ICD-10-CM | POA: Diagnosis not present

## 2022-04-26 DIAGNOSIS — I10 Essential (primary) hypertension: Secondary | ICD-10-CM | POA: Diagnosis not present

## 2022-04-28 ENCOUNTER — Other Ambulatory Visit: Payer: Self-pay | Admitting: Internal Medicine

## 2022-04-28 DIAGNOSIS — F3342 Major depressive disorder, recurrent, in full remission: Secondary | ICD-10-CM | POA: Diagnosis not present

## 2022-04-28 DIAGNOSIS — K7460 Unspecified cirrhosis of liver: Secondary | ICD-10-CM | POA: Diagnosis not present

## 2022-04-28 DIAGNOSIS — J9601 Acute respiratory failure with hypoxia: Secondary | ICD-10-CM | POA: Diagnosis not present

## 2022-04-28 DIAGNOSIS — J180 Bronchopneumonia, unspecified organism: Secondary | ICD-10-CM | POA: Diagnosis not present

## 2022-04-28 DIAGNOSIS — M810 Age-related osteoporosis without current pathological fracture: Secondary | ICD-10-CM | POA: Diagnosis not present

## 2022-04-28 DIAGNOSIS — I10 Essential (primary) hypertension: Secondary | ICD-10-CM | POA: Diagnosis not present

## 2022-04-28 MED ORDER — GABAPENTIN 300 MG PO CAPS: 300.0000 mg | ORAL_CAPSULE | Freq: Two times a day (BID) | ORAL | 2 refills | Status: DC

## 2022-04-30 DIAGNOSIS — M810 Age-related osteoporosis without current pathological fracture: Secondary | ICD-10-CM | POA: Diagnosis not present

## 2022-04-30 DIAGNOSIS — J9601 Acute respiratory failure with hypoxia: Secondary | ICD-10-CM | POA: Diagnosis not present

## 2022-04-30 DIAGNOSIS — J180 Bronchopneumonia, unspecified organism: Secondary | ICD-10-CM | POA: Diagnosis not present

## 2022-04-30 DIAGNOSIS — K7460 Unspecified cirrhosis of liver: Secondary | ICD-10-CM | POA: Diagnosis not present

## 2022-04-30 DIAGNOSIS — I10 Essential (primary) hypertension: Secondary | ICD-10-CM | POA: Diagnosis not present

## 2022-04-30 DIAGNOSIS — F3342 Major depressive disorder, recurrent, in full remission: Secondary | ICD-10-CM | POA: Diagnosis not present

## 2022-05-04 DIAGNOSIS — K7460 Unspecified cirrhosis of liver: Secondary | ICD-10-CM | POA: Diagnosis not present

## 2022-05-04 DIAGNOSIS — J180 Bronchopneumonia, unspecified organism: Secondary | ICD-10-CM | POA: Diagnosis not present

## 2022-05-04 DIAGNOSIS — F3342 Major depressive disorder, recurrent, in full remission: Secondary | ICD-10-CM | POA: Diagnosis not present

## 2022-05-04 DIAGNOSIS — M810 Age-related osteoporosis without current pathological fracture: Secondary | ICD-10-CM | POA: Diagnosis not present

## 2022-05-04 DIAGNOSIS — J9601 Acute respiratory failure with hypoxia: Secondary | ICD-10-CM | POA: Diagnosis not present

## 2022-05-04 DIAGNOSIS — I10 Essential (primary) hypertension: Secondary | ICD-10-CM | POA: Diagnosis not present

## 2022-05-05 ENCOUNTER — Ambulatory Visit: Payer: Medicare Other | Admitting: Internal Medicine

## 2022-05-06 DIAGNOSIS — J180 Bronchopneumonia, unspecified organism: Secondary | ICD-10-CM | POA: Diagnosis not present

## 2022-05-06 DIAGNOSIS — J9601 Acute respiratory failure with hypoxia: Secondary | ICD-10-CM | POA: Diagnosis not present

## 2022-05-06 DIAGNOSIS — K7460 Unspecified cirrhosis of liver: Secondary | ICD-10-CM | POA: Diagnosis not present

## 2022-05-06 DIAGNOSIS — I10 Essential (primary) hypertension: Secondary | ICD-10-CM | POA: Diagnosis not present

## 2022-05-06 DIAGNOSIS — M810 Age-related osteoporosis without current pathological fracture: Secondary | ICD-10-CM | POA: Diagnosis not present

## 2022-05-06 DIAGNOSIS — F3342 Major depressive disorder, recurrent, in full remission: Secondary | ICD-10-CM | POA: Diagnosis not present

## 2022-05-10 ENCOUNTER — Other Ambulatory Visit: Payer: Self-pay | Admitting: Internal Medicine

## 2022-05-10 ENCOUNTER — Ambulatory Visit: Payer: Medicare Other | Admitting: Internal Medicine

## 2022-05-10 ENCOUNTER — Encounter: Payer: Self-pay | Admitting: Internal Medicine

## 2022-05-10 VITALS — BP 120/68 | HR 63 | Temp 97.6°F | Resp 18 | Ht 71.0 in | Wt 156.1 lb

## 2022-05-10 DIAGNOSIS — G8929 Other chronic pain: Secondary | ICD-10-CM

## 2022-05-10 DIAGNOSIS — M545 Low back pain, unspecified: Secondary | ICD-10-CM | POA: Diagnosis not present

## 2022-05-10 MED ORDER — OXYCODONE-ACETAMINOPHEN 7.5-325 MG PO TABS: 1.0000 | ORAL_TABLET | Freq: Two times a day (BID) | ORAL | 0 refills | Status: DC

## 2022-05-10 NOTE — Progress Notes (Addendum)
   Acute Office Visit  Subjective:     Patient ID: Andrew Poole, male    DOB: 02-13-1936, 87 y.o.   MRN: [id number]  Chief Complaint  Patient presents with   Change in medication    Change in pain medication    HPI Patient is in today for left side back pain that is not controlled inspite of taking tramadol three time a day, meloxicam and tylenol along with gabapentin. He says his pain is 5/10 in intensity. His family came the other day and saying that their father quality of life is very poor because of pain, he does not walk much, he has unsteady gait. He is getting PT, he also has back surgery few years ago but pain came back.   Review of Systems  Constitutional: Negative.   Musculoskeletal:  Positive for back pain.        Objective:    BP 120/68 (BP Location: Left Arm, Patient Position: Sitting, Cuff Size: Normal)   Pulse 63   Temp 97.6 F (36.4 C)   Resp 18   Ht '5\' 11"'$  (1.803 m)   Wt 156 lb 2 oz (70.8 kg)   SpO2 99%   BMI 21.78 kg/m    Physical Exam Constitutional:      Appearance: Normal appearance.  Neurological:     General: No focal deficit present.     Mental Status: He is oriented to person, place, and time. Mental status is at baseline.     No results found for any visits on 05/10/22.      Assessment & Plan:   Problem List Items Addressed This Visit       Other   Back pain - Primary    Will stop tramadol and tylenol and start 1 percocet twice a day. He will watch for constipation.      Relevant Medications   oxyCODONE-acetaminophen (PERCOCET) 7.5-325 MG tablet    No orders of the defined types were placed in this encounter.   Return in about 1 month (around 06/10/2022).  Garwin Brothers, MD

## 2022-05-10 NOTE — Assessment & Plan Note (Addendum)
Will stop tramadol and tylenol and start 1 percocet twice a day. He will watch for constipation.

## 2022-05-13 DIAGNOSIS — N281 Cyst of kidney, acquired: Secondary | ICD-10-CM

## 2022-05-13 DIAGNOSIS — R262 Difficulty in walking, not elsewhere classified: Secondary | ICD-10-CM

## 2022-05-13 DIAGNOSIS — R634 Abnormal weight loss: Secondary | ICD-10-CM | POA: Diagnosis not present

## 2022-05-13 DIAGNOSIS — R531 Weakness: Secondary | ICD-10-CM | POA: Diagnosis not present

## 2022-05-13 DIAGNOSIS — M545 Low back pain, unspecified: Secondary | ICD-10-CM

## 2022-05-13 DIAGNOSIS — G8929 Other chronic pain: Secondary | ICD-10-CM | POA: Diagnosis not present

## 2022-05-14 DIAGNOSIS — K7460 Unspecified cirrhosis of liver: Secondary | ICD-10-CM | POA: Diagnosis not present

## 2022-05-14 DIAGNOSIS — J180 Bronchopneumonia, unspecified organism: Secondary | ICD-10-CM | POA: Diagnosis not present

## 2022-05-14 DIAGNOSIS — F3342 Major depressive disorder, recurrent, in full remission: Secondary | ICD-10-CM | POA: Diagnosis not present

## 2022-05-14 DIAGNOSIS — M810 Age-related osteoporosis without current pathological fracture: Secondary | ICD-10-CM | POA: Diagnosis not present

## 2022-05-14 DIAGNOSIS — J9601 Acute respiratory failure with hypoxia: Secondary | ICD-10-CM | POA: Diagnosis not present

## 2022-05-14 DIAGNOSIS — I10 Essential (primary) hypertension: Secondary | ICD-10-CM | POA: Diagnosis not present

## 2022-05-15 DIAGNOSIS — K7460 Unspecified cirrhosis of liver: Secondary | ICD-10-CM | POA: Diagnosis not present

## 2022-05-15 DIAGNOSIS — N2889 Other specified disorders of kidney and ureter: Secondary | ICD-10-CM | POA: Diagnosis not present

## 2022-05-15 DIAGNOSIS — Z87891 Personal history of nicotine dependence: Secondary | ICD-10-CM | POA: Diagnosis not present

## 2022-05-15 DIAGNOSIS — E876 Hypokalemia: Secondary | ICD-10-CM | POA: Diagnosis not present

## 2022-05-15 DIAGNOSIS — E785 Hyperlipidemia, unspecified: Secondary | ICD-10-CM | POA: Diagnosis not present

## 2022-05-15 DIAGNOSIS — Z9181 History of falling: Secondary | ICD-10-CM | POA: Diagnosis not present

## 2022-05-15 DIAGNOSIS — Z791 Long term (current) use of non-steroidal anti-inflammatories (NSAID): Secondary | ICD-10-CM | POA: Diagnosis not present

## 2022-05-15 DIAGNOSIS — J9601 Acute respiratory failure with hypoxia: Secondary | ICD-10-CM | POA: Diagnosis not present

## 2022-05-15 DIAGNOSIS — Z79899 Other long term (current) drug therapy: Secondary | ICD-10-CM | POA: Diagnosis not present

## 2022-05-15 DIAGNOSIS — F3342 Major depressive disorder, recurrent, in full remission: Secondary | ICD-10-CM | POA: Diagnosis not present

## 2022-05-15 DIAGNOSIS — Z792 Long term (current) use of antibiotics: Secondary | ICD-10-CM | POA: Diagnosis not present

## 2022-05-15 DIAGNOSIS — Z7982 Long term (current) use of aspirin: Secondary | ICD-10-CM | POA: Diagnosis not present

## 2022-05-15 DIAGNOSIS — Z556 Problems related to health literacy: Secondary | ICD-10-CM | POA: Diagnosis not present

## 2022-05-15 DIAGNOSIS — F1090 Alcohol use, unspecified, uncomplicated: Secondary | ICD-10-CM | POA: Diagnosis not present

## 2022-05-15 DIAGNOSIS — J180 Bronchopneumonia, unspecified organism: Secondary | ICD-10-CM | POA: Diagnosis not present

## 2022-05-15 DIAGNOSIS — M1990 Unspecified osteoarthritis, unspecified site: Secondary | ICD-10-CM | POA: Diagnosis not present

## 2022-05-15 DIAGNOSIS — I10 Essential (primary) hypertension: Secondary | ICD-10-CM | POA: Diagnosis not present

## 2022-05-15 DIAGNOSIS — M47819 Spondylosis without myelopathy or radiculopathy, site unspecified: Secondary | ICD-10-CM | POA: Diagnosis not present

## 2022-05-15 DIAGNOSIS — M810 Age-related osteoporosis without current pathological fracture: Secondary | ICD-10-CM | POA: Diagnosis not present

## 2022-05-20 DIAGNOSIS — J9601 Acute respiratory failure with hypoxia: Secondary | ICD-10-CM | POA: Diagnosis not present

## 2022-05-20 DIAGNOSIS — M810 Age-related osteoporosis without current pathological fracture: Secondary | ICD-10-CM | POA: Diagnosis not present

## 2022-05-20 DIAGNOSIS — K7460 Unspecified cirrhosis of liver: Secondary | ICD-10-CM | POA: Diagnosis not present

## 2022-05-20 DIAGNOSIS — J180 Bronchopneumonia, unspecified organism: Secondary | ICD-10-CM | POA: Diagnosis not present

## 2022-05-20 DIAGNOSIS — F3342 Major depressive disorder, recurrent, in full remission: Secondary | ICD-10-CM | POA: Diagnosis not present

## 2022-05-20 DIAGNOSIS — I10 Essential (primary) hypertension: Secondary | ICD-10-CM | POA: Diagnosis not present

## 2022-05-25 DIAGNOSIS — M810 Age-related osteoporosis without current pathological fracture: Secondary | ICD-10-CM | POA: Diagnosis not present

## 2022-05-25 DIAGNOSIS — I10 Essential (primary) hypertension: Secondary | ICD-10-CM | POA: Diagnosis not present

## 2022-05-25 DIAGNOSIS — F3342 Major depressive disorder, recurrent, in full remission: Secondary | ICD-10-CM | POA: Diagnosis not present

## 2022-05-25 DIAGNOSIS — J180 Bronchopneumonia, unspecified organism: Secondary | ICD-10-CM | POA: Diagnosis not present

## 2022-05-25 DIAGNOSIS — J9601 Acute respiratory failure with hypoxia: Secondary | ICD-10-CM | POA: Diagnosis not present

## 2022-05-25 DIAGNOSIS — K7460 Unspecified cirrhosis of liver: Secondary | ICD-10-CM | POA: Diagnosis not present

## 2022-05-31 DIAGNOSIS — J180 Bronchopneumonia, unspecified organism: Secondary | ICD-10-CM | POA: Diagnosis not present

## 2022-05-31 DIAGNOSIS — F3342 Major depressive disorder, recurrent, in full remission: Secondary | ICD-10-CM | POA: Diagnosis not present

## 2022-05-31 DIAGNOSIS — J9601 Acute respiratory failure with hypoxia: Secondary | ICD-10-CM | POA: Diagnosis not present

## 2022-05-31 DIAGNOSIS — K7460 Unspecified cirrhosis of liver: Secondary | ICD-10-CM | POA: Diagnosis not present

## 2022-05-31 DIAGNOSIS — I10 Essential (primary) hypertension: Secondary | ICD-10-CM | POA: Diagnosis not present

## 2022-05-31 DIAGNOSIS — M810 Age-related osteoporosis without current pathological fracture: Secondary | ICD-10-CM | POA: Diagnosis not present

## 2022-06-01 ENCOUNTER — Other Ambulatory Visit: Payer: Self-pay | Admitting: Internal Medicine

## 2022-06-02 ENCOUNTER — Other Ambulatory Visit: Payer: Self-pay | Admitting: Internal Medicine

## 2022-06-02 ENCOUNTER — Encounter: Payer: Self-pay | Admitting: Internal Medicine

## 2022-06-07 ENCOUNTER — Other Ambulatory Visit: Payer: Self-pay | Admitting: Internal Medicine

## 2022-06-07 MED ORDER — SERTRALINE HCL 50 MG PO TABS: 50.0000 mg | ORAL_TABLET | Freq: Every day | ORAL | 0 refills | Status: DC

## 2022-06-07 MED ORDER — OXYCODONE-ACETAMINOPHEN 7.5-325 MG PO TABS: 1.0000 | ORAL_TABLET | Freq: Two times a day (BID) | ORAL | 0 refills | Status: DC

## 2022-06-10 DIAGNOSIS — J9601 Acute respiratory failure with hypoxia: Secondary | ICD-10-CM | POA: Diagnosis not present

## 2022-06-10 DIAGNOSIS — J180 Bronchopneumonia, unspecified organism: Secondary | ICD-10-CM | POA: Diagnosis not present

## 2022-06-10 DIAGNOSIS — K7460 Unspecified cirrhosis of liver: Secondary | ICD-10-CM | POA: Diagnosis not present

## 2022-06-10 DIAGNOSIS — M810 Age-related osteoporosis without current pathological fracture: Secondary | ICD-10-CM | POA: Diagnosis not present

## 2022-06-10 DIAGNOSIS — I10 Essential (primary) hypertension: Secondary | ICD-10-CM | POA: Diagnosis not present

## 2022-06-10 DIAGNOSIS — F3342 Major depressive disorder, recurrent, in full remission: Secondary | ICD-10-CM | POA: Diagnosis not present

## 2022-06-11 ENCOUNTER — Ambulatory Visit: Payer: Medicare Other | Admitting: Internal Medicine

## 2022-06-21 ENCOUNTER — Ambulatory Visit: Payer: Medicare Other | Admitting: Internal Medicine

## 2022-06-28 ENCOUNTER — Ambulatory Visit: Payer: Medicare Other | Admitting: Internal Medicine

## 2022-06-28 ENCOUNTER — Encounter: Payer: Self-pay | Admitting: Internal Medicine

## 2022-06-28 VITALS — BP 122/70 | HR 78 | Temp 97.3°F | Resp 18 | Ht 71.0 in | Wt 158.2 lb

## 2022-06-28 DIAGNOSIS — I10 Essential (primary) hypertension: Secondary | ICD-10-CM

## 2022-06-28 DIAGNOSIS — M545 Low back pain, unspecified: Secondary | ICD-10-CM

## 2022-06-28 DIAGNOSIS — R262 Difficulty in walking, not elsewhere classified: Secondary | ICD-10-CM | POA: Diagnosis not present

## 2022-06-28 DIAGNOSIS — E785 Hyperlipidemia, unspecified: Secondary | ICD-10-CM | POA: Diagnosis not present

## 2022-06-28 DIAGNOSIS — F3342 Major depressive disorder, recurrent, in full remission: Secondary | ICD-10-CM

## 2022-06-28 DIAGNOSIS — G8929 Other chronic pain: Secondary | ICD-10-CM

## 2022-06-28 MED ORDER — AMLODIPINE BESYLATE 2.5 MG PO TABS: 2.5000 mg | ORAL_TABLET | Freq: Every day | ORAL | 0 refills | Status: DC

## 2022-06-28 MED ORDER — OXYCODONE-ACETAMINOPHEN 7.5-325 MG PO TABS: 1.0000 | ORAL_TABLET | Freq: Two times a day (BID) | ORAL | 0 refills | Status: DC

## 2022-06-28 NOTE — Assessment & Plan Note (Signed)
Better with sertraline and abilify.

## 2022-06-28 NOTE — Progress Notes (Signed)
   Office Visit  Subjective   Patient ID: Andrew Poole   DOB: 1936-04-07   Age: 87 y.o.   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 He was admitted to hospital on 04/11/22, after a fall at home. He was treated for community acquired pneumonia and respiratory failure. He was discharged home on 04/13/22. He finished last dose of antibiotic today. He is very weak and it is difficult for him to get up from chair and has unsteady gait. Today PT will come to his house to evaluate.  He also has hematuria and CT scan abdomen showed hemorrhagic cyst and he was advised to follow up with [place].   He also is c/o back pain and leg weakness. He had back surgery few years ago.   He was also noted to have hematuria in the hospital and CT scan abdomen shows hemorrhagic cyst in the kidney and he was advised to have follow-up with me.  I repeated urine analysis in office that was negative for any hematuria.    Past Medical History No past medical history on file.   Allergies No Known Allergies   Review of Systems Review of Systems  Constitutional: Negative.   HENT: Negative.    Respiratory: Negative.    Cardiovascular: Negative.   Gastrointestinal: Negative.   Neurological: Negative.   Psychiatric/Behavioral:  Negative for depression.        Objective:    Vitals BP 122/70 (BP Location: Left Arm, Patient Position: Sitting, Cuff Size: Normal)   Pulse 78   Temp (!) 97.3 F (36.3 C)   Resp 18   Ht 5\' 11"  (1.803 m)   Wt 158 lb 4 oz (71.8 kg)   SpO2 97%   BMI 22.07 kg/m    Physical Examination Physical Exam Constitutional:      Appearance: Normal appearance. He is normal weight.  HENT:     Head: Normocephalic and atraumatic.  Eyes:     Extraocular Movements: Extraocular movements intact.      Pupils: Pupils are equal, round, and reactive to light.  Cardiovascular:     Rate and Rhythm: Normal rate and regular rhythm.     Heart sounds: Normal heart sounds.  Pulmonary:     Effort: Pulmonary effort is normal.     Breath sounds: Normal breath sounds.  Abdominal:     General: Bowel sounds are normal.     Palpations: Abdomen is soft.  Neurological:     General: No focal deficit present.     Mental Status: He is alert and oriented to person, place, and time.        Assessment & Plan:   Back pain He takes percocet as needed only and his pain is better.   Ambulatory dysfunction He is not getting any PT.  Hypertension Well controlled.  Recurrent major depressive disorder, in full remission ([place]) Better with sertraline and abilify.   Dyslipidemia Stable.     Return in about 2 months (around 08/28/2022).   Garwin Brothers, MD

## 2022-06-28 NOTE — Assessment & Plan Note (Signed)
He is not getting any PT.

## 2022-06-28 NOTE — Assessment & Plan Note (Signed)
Stable

## 2022-06-28 NOTE — Assessment & Plan Note (Signed)
He takes percocet as needed only and his pain is better.

## 2022-06-28 NOTE — Assessment & Plan Note (Signed)
Well controlled 

## 2022-07-01 DIAGNOSIS — H61303 Acquired stenosis of external ear canal, unspecified, bilateral: Secondary | ICD-10-CM | POA: Diagnosis not present

## 2022-07-01 DIAGNOSIS — H6123 Impacted cerumen, bilateral: Secondary | ICD-10-CM | POA: Diagnosis not present

## 2022-07-01 DIAGNOSIS — H9193 Unspecified hearing loss, bilateral: Secondary | ICD-10-CM | POA: Diagnosis not present

## 2022-07-14 ENCOUNTER — Other Ambulatory Visit: Payer: Self-pay | Admitting: Internal Medicine

## 2022-07-15 ENCOUNTER — Other Ambulatory Visit: Payer: Self-pay | Admitting: Internal Medicine

## 2022-07-15 MED ORDER — OXYCODONE-ACETAMINOPHEN 7.5-325 MG PO TABS: 1.0000 | ORAL_TABLET | Freq: Two times a day (BID) | ORAL | 0 refills | Status: DC

## 2022-07-15 NOTE — Progress Notes (Signed)
I have spoken with his daughter and she is worried that her father is getting more confused, forgetfull and stay sleepy during day time. He does not want to take too much pain medications either. I have suggested that he can come to our office so I can evaluate his memory. In the mean time family will give his gabapentin during night time only.

## 2022-07-17 ENCOUNTER — Other Ambulatory Visit: Payer: Self-pay | Admitting: Internal Medicine

## 2022-07-19 ENCOUNTER — Ambulatory Visit: Payer: Medicare Other | Admitting: Internal Medicine

## 2022-07-19 ENCOUNTER — Encounter: Payer: Self-pay | Admitting: Internal Medicine

## 2022-07-19 VITALS — BP 124/72 | HR 87 | Temp 97.7°F | Resp 18 | Ht 71.0 in | Wt 160.0 lb

## 2022-07-19 DIAGNOSIS — M810 Age-related osteoporosis without current pathological fracture: Secondary | ICD-10-CM

## 2022-07-19 DIAGNOSIS — G8929 Other chronic pain: Secondary | ICD-10-CM

## 2022-07-19 DIAGNOSIS — E785 Hyperlipidemia, unspecified: Secondary | ICD-10-CM | POA: Diagnosis not present

## 2022-07-19 DIAGNOSIS — F3342 Major depressive disorder, recurrent, in full remission: Secondary | ICD-10-CM

## 2022-07-19 DIAGNOSIS — M545 Low back pain, unspecified: Secondary | ICD-10-CM | POA: Diagnosis not present

## 2022-07-19 DIAGNOSIS — I10 Essential (primary) hypertension: Secondary | ICD-10-CM | POA: Diagnosis not present

## 2022-07-19 NOTE — Assessment & Plan Note (Signed)
I will do testosterone level and then re-evaluate treatment for osteoprosis.

## 2022-07-19 NOTE — Assessment & Plan Note (Signed)
stable °

## 2022-07-19 NOTE — Assessment & Plan Note (Signed)
Will do lipid panel 

## 2022-07-19 NOTE — Progress Notes (Signed)
   Office Visit  Subjective   Patient ID: Umut Kizzire   DOB: October 08, 1935   Age: 87 y.o.   MRN: [id number]   Chief Complaint Chief Complaint  Patient presents with   Follow-up    Memory evaluation      History of Present Illness 87 years old male who is here for follow up. He has gained 2 more pounds since last visit.  He feel that his depression is better since  Abilify was added to sertraline.  He has hypertension and his BP is well controlled with amlodipine 2.5 mg daily.  He also has dyslipidemia and take atorvastatin 10 mg daily. Last lipid panel was done 3/23.   He has chronic left side low back pain that does not radiate to left leg any more. He take gabapentin and percocet but he does not like to take it unless he really need it. He also has osteoprosis but have not started any treatment.   Past Medical History No past medical history on file.   Allergies No Known Allergies   Review of Systems Review of Systems  Constitutional: Negative.   HENT: Negative.    Respiratory: Negative.    Cardiovascular: Negative.   Gastrointestinal: Negative.   Neurological: Negative.        Objective:    Vitals BP 124/72 (BP Location: Left Arm, Patient Position: Sitting, Cuff Size: Normal)   Pulse 87   Temp 97.7 F (36.5 C)   Resp 18   Ht 5\' 11"  (1.803 m)   Wt 160 lb (72.6 kg)   SpO2 97%   BMI 22.32 kg/m    Physical Examination Physical Exam Constitutional:      Appearance: Normal appearance.  HENT:     Head: Normocephalic and atraumatic.  Eyes:     Extraocular Movements: Extraocular movements intact.     Pupils: Pupils are equal, round, and reactive to light.  Cardiovascular:     Rate and Rhythm: Normal rate and regular rhythm.     Heart sounds: Normal heart sounds.  Pulmonary:     Effort: Pulmonary effort is normal.     Breath sounds: Normal breath sounds.  Abdominal:     General: Bowel sounds are normal.     Palpations: Abdomen is soft.  Neurological:      General: No focal deficit present.     Mental Status: He is alert and oriented to person, place, and time.        Assessment & Plan:   Hypertension Stable  Osteoporosis I will do testosterone level and then re-evaluate treatment for osteoprosis.  Recurrent major depressive disorder, in full remission (HCC) stable  Dyslipidemia Will do lipid panel  Back pain Stable with current pain medication.    Return in about 2 months (around 09/18/2022).   Eloisa Northern, MD

## 2022-07-19 NOTE — Assessment & Plan Note (Signed)
Stable

## 2022-07-19 NOTE — Assessment & Plan Note (Signed)
Stable with current pain medication.

## 2022-07-20 LAB — LIPID PANEL
Chol/HDL Ratio: 2 ratio (ref 0.0–5.0)
Cholesterol, Total: 199 mg/dL (ref 100–199)
HDL: 98 mg/dL (ref >39)
LDL Chol Calc (NIH): 90 mg/dL (ref 0–99)
Triglycerides: 58 mg/dL (ref 0–149)
VLDL Cholesterol Cal: 11 mg/dL (ref 5–40)

## 2022-07-20 LAB — TESTOSTERONE: Testosterone: 520 ng/dL (ref 264–916)

## 2022-07-20 LAB — CMP14 + ANION GAP
ALT: 24 IU/L (ref 0–44)
AST: 26 IU/L (ref 0–40)
Albumin/Globulin Ratio: 1.7 (ref 1.2–2.2)
Albumin: 4.5 g/dL (ref 3.7–4.7)
Alkaline Phosphatase: 59 IU/L (ref 44–121)
Anion Gap: 15 mmol/L (ref 10.0–18.0)
BUN/Creatinine Ratio: 30 — ABNORMAL HIGH (ref 10–24)
BUN: 26 mg/dL (ref 8–27)
Bilirubin Total: 0.3 mg/dL (ref 0.0–1.2)
CO2: 26 mmol/L (ref 20–29)
Calcium: 9.8 mg/dL (ref 8.6–10.2)
Chloride: 105 mmol/L (ref 96–106)
Creatinine, Ser: 0.86 mg/dL (ref 0.76–1.27)
Globulin, Total: 2.6 g/dL (ref 1.5–4.5)
Glucose: 70 mg/dL (ref 70–99)
Potassium: 4.6 mmol/L (ref 3.5–5.2)
Sodium: 146 mmol/L — ABNORMAL HIGH (ref 134–144)
Total Protein: 7.1 g/dL (ref 6.0–8.5)
eGFR: 85 mL/min/{1.73_m2} (ref >59)

## 2022-07-20 LAB — VITAMIN D 25 HYDROXY (VIT D DEFICIENCY, FRACTURES): Vit D, 25-Hydroxy: 24.1 ng/mL — ABNORMAL LOW (ref 30.0–100.0)

## 2022-07-20 NOTE — Progress Notes (Signed)
Patient called.  Patient aware.     His labs are good including testosterone except vitamin D is low. He need vitamin D 5000 IU daily for 3 months then 2000 daily after that

## 2022-08-03 ENCOUNTER — Other Ambulatory Visit: Payer: Self-pay | Admitting: Internal Medicine

## 2022-08-10 ENCOUNTER — Other Ambulatory Visit: Payer: Self-pay | Admitting: Internal Medicine

## 2022-08-19 DIAGNOSIS — Z9889 Other specified postprocedural states: Secondary | ICD-10-CM | POA: Diagnosis not present

## 2022-08-19 DIAGNOSIS — M4156 Other secondary scoliosis, lumbar region: Secondary | ICD-10-CM | POA: Diagnosis not present

## 2022-08-27 ENCOUNTER — Ambulatory Visit: Payer: Medicare Other | Admitting: Internal Medicine

## 2022-09-16 ENCOUNTER — Other Ambulatory Visit: Payer: Self-pay | Admitting: Internal Medicine

## 2022-09-20 ENCOUNTER — Encounter: Payer: Self-pay | Admitting: Internal Medicine

## 2022-09-20 ENCOUNTER — Ambulatory Visit: Payer: Medicare Other | Admitting: Internal Medicine

## 2022-09-20 VITALS — BP 118/70 | HR 66 | Temp 97.2°F | Resp 18 | Ht 71.0 in | Wt 164.2 lb

## 2022-09-20 DIAGNOSIS — M545 Low back pain, unspecified: Secondary | ICD-10-CM | POA: Diagnosis not present

## 2022-09-20 DIAGNOSIS — G8929 Other chronic pain: Secondary | ICD-10-CM | POA: Diagnosis not present

## 2022-09-20 DIAGNOSIS — I10 Essential (primary) hypertension: Secondary | ICD-10-CM

## 2022-09-20 DIAGNOSIS — E785 Hyperlipidemia, unspecified: Secondary | ICD-10-CM | POA: Diagnosis not present

## 2022-09-20 DIAGNOSIS — F02A0 Dementia in other diseases classified elsewhere, mild, without behavioral disturbance, psychotic disturbance, mood disturbance, and anxiety: Secondary | ICD-10-CM | POA: Diagnosis not present

## 2022-09-20 DIAGNOSIS — F3342 Major depressive disorder, recurrent, in full remission: Secondary | ICD-10-CM

## 2022-09-20 DIAGNOSIS — G301 Alzheimer's disease with late onset: Secondary | ICD-10-CM

## 2022-09-20 MED ORDER — OXYCODONE-ACETAMINOPHEN 7.5-325 MG PO TABS: 1.0000 | ORAL_TABLET | Freq: Two times a day (BID) | ORAL | 0 refills | Status: DC

## 2022-09-20 MED ORDER — DONEPEZIL HCL 5 MG PO TABS: 5.0000 mg | ORAL_TABLET | Freq: Every day | ORAL | 2 refills | Status: DC

## 2022-09-20 NOTE — Progress Notes (Addendum)
   Office Visit  Subjective   Patient ID: Andrew Poole   DOB: 1935/05/26   Age: 87 y.o.   MRN: [id number]   Chief Complaint Chief Complaint  Patient presents with   Follow-up    Memory Assessment     History of Present Illness 87 years old male who is here for follow up of dementia assessment, he says his wife passed away 1 month ago after long illness. This was a tough month for him. He says his daughter has moved in with him, plan for him to move to [facility] assisted [facility]. He says that he will continue to follow me here.   He has gained 2 pounds since last visit.  He is here for dementia evaluation, he score 23/30 on MMSE but 0/3 on 1 minute recall. He says he has not noticed that he repeat same question again and again. He family was worried about him. He is willing to try donepezil 5 mg daily.   He says his back pain is better and take 1-2 percocet per day that help him to move. He walk slowly with walking stick.  He feel that his depression is better since  Abilify was added to sertraline.  He has hypertension and his BP is well controlled with amlodipine 2.5 mg daily.  He also has dyslipidemia and take atorvastatin 10 mg daily. Last lipid panel was done 3/23.    He has chronic left side low back pain that does not radiate to left leg any more. He take gabapentin and percocet.    Past Medical History No past medical history on file.   Allergies No Known Allergies   Review of Systems Review of Systems  Constitutional: Negative.   HENT: Negative.    Respiratory: Negative.    Cardiovascular: Negative.   Gastrointestinal: Negative.   Musculoskeletal:  Positive for back pain.  Neurological: Negative.        Objective:    Vitals BP 118/70 (BP Location: Left Arm, Patient Position: Sitting, Cuff Size: Normal)   Pulse 66   Temp (!) 97.2 F (36.2 C)   Resp 18   Ht 5\' 11"  (1.803 m)   Wt 164 lb 4 oz (74.5 kg)   SpO2 92%   BMI 22.91 kg/m    Physical  Examination Physical Exam Constitutional:      Appearance: Normal appearance.  HENT:     Head: Normocephalic and atraumatic.  Cardiovascular:     Rate and Rhythm: Normal rate and regular rhythm.     Heart sounds: Normal heart sounds.  Pulmonary:     Effort: Pulmonary effort is normal.     Breath sounds: Normal breath sounds.  Abdominal:     General: Bowel sounds are normal.     Palpations: Abdomen is soft.  Neurological:     General: No focal deficit present.     Mental Status: He is alert.       Assessment & Plan:   Hypertension controlled  Mild late onset Alzheimer's dementia without behavioral disturbance, psychotic disturbance, mood disturbance, or anxiety (HCC) I will start him on donezepil 5 mg dailyand monitor.  Recurrent major depressive disorder, in full remission (HCC) Better end handling grief well.  Back pain I have refill his percocet for pain.     Return in about 1 month (around 10/20/2022).   Eloisa Northern, MD

## 2022-09-20 NOTE — Assessment & Plan Note (Signed)
Better end handling grief well.

## 2022-09-20 NOTE — Assessment & Plan Note (Signed)
I will start him on donezepil 5 mg dailyand monitor.

## 2022-09-20 NOTE — Assessment & Plan Note (Signed)
controlled 

## 2022-09-20 NOTE — Assessment & Plan Note (Signed)
I have refill his percocet for pain.

## 2022-09-22 ENCOUNTER — Other Ambulatory Visit: Payer: Self-pay | Admitting: Internal Medicine

## 2022-09-22 NOTE — Telephone Encounter (Signed)
Medication was dc by Amin on 06/07/22

## 2022-09-24 ENCOUNTER — Other Ambulatory Visit: Payer: Self-pay | Admitting: Internal Medicine

## 2022-10-04 ENCOUNTER — Other Ambulatory Visit: Payer: Self-pay | Admitting: Internal Medicine

## 2022-10-05 ENCOUNTER — Other Ambulatory Visit: Payer: Self-pay | Admitting: Internal Medicine

## 2022-10-05 MED ORDER — MELOXICAM 7.5 MG PO TABS: 7.5000 mg | ORAL_TABLET | Freq: Every day | ORAL | 3 refills | Status: DC | PRN

## 2022-10-11 ENCOUNTER — Other Ambulatory Visit: Payer: Self-pay | Admitting: Internal Medicine

## 2022-10-27 ENCOUNTER — Encounter: Payer: Self-pay | Admitting: Internal Medicine

## 2022-10-27 ENCOUNTER — Ambulatory Visit: Payer: Medicare Other | Admitting: Internal Medicine

## 2022-10-27 VITALS — BP 116/76 | HR 53 | Temp 97.6°F | Resp 18 | Ht 71.0 in | Wt 162.0 lb

## 2022-10-27 DIAGNOSIS — R262 Difficulty in walking, not elsewhere classified: Secondary | ICD-10-CM

## 2022-10-27 DIAGNOSIS — R29898 Other symptoms and signs involving the musculoskeletal system: Secondary | ICD-10-CM | POA: Diagnosis not present

## 2022-10-27 DIAGNOSIS — G301 Alzheimer's disease with late onset: Secondary | ICD-10-CM

## 2022-10-27 DIAGNOSIS — F3342 Major depressive disorder, recurrent, in full remission: Secondary | ICD-10-CM

## 2022-10-27 DIAGNOSIS — F02A0 Dementia in other diseases classified elsewhere, mild, without behavioral disturbance, psychotic disturbance, mood disturbance, and anxiety: Secondary | ICD-10-CM

## 2022-10-27 DIAGNOSIS — I10 Essential (primary) hypertension: Secondary | ICD-10-CM | POA: Diagnosis not present

## 2022-10-27 DIAGNOSIS — E785 Hyperlipidemia, unspecified: Secondary | ICD-10-CM | POA: Diagnosis not present

## 2022-10-27 MED ORDER — SERTRALINE HCL 50 MG PO TABS
50.0000 mg | ORAL_TABLET | Freq: Every day | ORAL | 1 refills | Status: DC
  Filled 2023-01-14: qty 90, 90d supply, fill #0

## 2022-10-27 MED ORDER — MELOXICAM 7.5 MG PO TABS
7.5000 mg | ORAL_TABLET | Freq: Every day | ORAL | 1 refills | Status: DC | PRN
Start: 2022-10-27 — End: 2023-08-02
  Filled 2023-01-14: qty 90, 90d supply, fill #0

## 2022-10-27 NOTE — Assessment & Plan Note (Signed)
 Well controlled 

## 2022-10-27 NOTE — Assessment & Plan Note (Signed)
I will get PT/OT evaluation and treatment.

## 2022-10-27 NOTE — Progress Notes (Signed)
   Office Visit  Subjective   Patient ID: Andrew Poole   DOB: [birth year]   Age: 87 y.o.   MRN: [id number]   Chief Complaint Chief Complaint  Patient presents with   Follow-up    Hypertension, Mild onset Alzheimer's dementia     History of Present Illness 87 years old male is here and he has moved to [facility] in [place] and he may transfer care with their provider if they did not allow [place] to see him there or he may do video conference.    He has left leg weakness from back pain and surgery. He is asking for PT/OT evaluation and treatment as his family has a concern. He walk with walker and has no fall. He takes meloxicam for pain and he take it every day.   Past Medical History No past medical history on file.   Allergies No Known Allergies   Review of Systems Review of Systems  Neurological:  Positive for weakness.       Objective:    Vitals BP 116/76 (BP Location: Left Arm, Patient Position: Sitting, Cuff Size: Normal)   Pulse (!) 53   Temp 97.6 F (36.4 C)   Resp 18   Ht 5\' 11"  (1.803 m)   Wt 162 lb (73.5 kg)   SpO2 96%   BMI 22.59 kg/m    Physical Examination Physical Exam Constitutional:      Appearance: Normal appearance.  HENT:     Head: Normocephalic and atraumatic.  Cardiovascular:     Rate and Rhythm: Normal rate and regular rhythm.     Heart sounds: Normal heart sounds.  Pulmonary:     Effort: Pulmonary effort is normal.     Breath sounds: Normal breath sounds.  Abdominal:     General: Bowel sounds are normal.     Palpations: Abdomen is soft.  Neurological:     General: No focal deficit present.     Mental Status: He is alert and oriented to person, place, and time.        Assessment & Plan:   Left leg weakness I will get PT/OT evaluation and treatment.  Hypertension better  Mild late onset Alzheimer's dementia without behavioral disturbance, psychotic disturbance, mood disturbance, or anxiety (HCC) He takes aricept 5 mg daily  and that is helping  Recurrent major depressive disorder, in full remission (HCC) Better with sertraline 50 mg and abilify 2.5 mg daily. No extra pyrimal movement  Dyslipidemia Well controlled    No follow-ups on file.   Eloisa Northern, MD

## 2022-10-27 NOTE — Assessment & Plan Note (Signed)
Better with sertraline 50 mg and abilify 2.5 mg daily. No extra pyrimal movement

## 2022-10-27 NOTE — Assessment & Plan Note (Signed)
better 

## 2022-10-27 NOTE — Assessment & Plan Note (Signed)
He takes aricept 5 mg daily and that is helping

## 2022-11-01 DIAGNOSIS — Z9181 History of falling: Secondary | ICD-10-CM | POA: Diagnosis not present

## 2022-11-01 DIAGNOSIS — M21372 Foot drop, left foot: Secondary | ICD-10-CM | POA: Diagnosis not present

## 2022-11-01 DIAGNOSIS — M419 Scoliosis, unspecified: Secondary | ICD-10-CM | POA: Diagnosis not present

## 2022-11-01 DIAGNOSIS — Z7982 Long term (current) use of aspirin: Secondary | ICD-10-CM | POA: Diagnosis not present

## 2022-11-01 DIAGNOSIS — G301 Alzheimer's disease with late onset: Secondary | ICD-10-CM | POA: Diagnosis not present

## 2022-11-01 DIAGNOSIS — I10 Essential (primary) hypertension: Secondary | ICD-10-CM | POA: Diagnosis not present

## 2022-11-01 DIAGNOSIS — F3342 Major depressive disorder, recurrent, in full remission: Secondary | ICD-10-CM | POA: Diagnosis not present

## 2022-11-01 DIAGNOSIS — M810 Age-related osteoporosis without current pathological fracture: Secondary | ICD-10-CM | POA: Diagnosis not present

## 2022-11-01 DIAGNOSIS — E785 Hyperlipidemia, unspecified: Secondary | ICD-10-CM | POA: Diagnosis not present

## 2022-11-01 DIAGNOSIS — F02A3 Dementia in other diseases classified elsewhere, mild, with mood disturbance: Secondary | ICD-10-CM | POA: Diagnosis not present

## 2022-11-09 ENCOUNTER — Other Ambulatory Visit (HOSPITAL_COMMUNITY): Payer: Self-pay

## 2022-11-09 ENCOUNTER — Other Ambulatory Visit: Payer: Self-pay | Admitting: Internal Medicine

## 2022-11-09 MED ORDER — MEMANTINE HCL 5 MG PO TABS
5.0000 mg | ORAL_TABLET | Freq: Two times a day (BID) | ORAL | 1 refills | Status: DC
Start: 2022-11-09 — End: 2023-01-14
  Filled 2022-11-09 – 2022-12-14 (×3): qty 60, 30d supply, fill #0

## 2022-11-09 NOTE — Progress Notes (Signed)
I have spoken with daughter, she says that patient is having night mares with this medicine so he has stopped it. Per daughter, aricept did helped him, he is not that confused. I have told that we will stop aricept and will start namenda 5 mg twice a day.

## 2022-11-10 ENCOUNTER — Other Ambulatory Visit (HOSPITAL_COMMUNITY): Payer: Self-pay

## 2022-11-11 DIAGNOSIS — F3342 Major depressive disorder, recurrent, in full remission: Secondary | ICD-10-CM | POA: Diagnosis not present

## 2022-11-11 DIAGNOSIS — M21372 Foot drop, left foot: Secondary | ICD-10-CM | POA: Diagnosis not present

## 2022-11-11 DIAGNOSIS — F02A3 Dementia in other diseases classified elsewhere, mild, with mood disturbance: Secondary | ICD-10-CM | POA: Diagnosis not present

## 2022-11-11 DIAGNOSIS — G301 Alzheimer's disease with late onset: Secondary | ICD-10-CM | POA: Diagnosis not present

## 2022-11-11 DIAGNOSIS — M810 Age-related osteoporosis without current pathological fracture: Secondary | ICD-10-CM | POA: Diagnosis not present

## 2022-11-11 DIAGNOSIS — M419 Scoliosis, unspecified: Secondary | ICD-10-CM | POA: Diagnosis not present

## 2022-11-15 ENCOUNTER — Other Ambulatory Visit: Payer: Self-pay

## 2022-11-15 ENCOUNTER — Other Ambulatory Visit (HOSPITAL_COMMUNITY): Payer: Self-pay

## 2022-11-15 ENCOUNTER — Other Ambulatory Visit: Payer: Self-pay | Admitting: Internal Medicine

## 2022-11-15 MED ORDER — MELOXICAM 7.5 MG PO TABS
7.5000 mg | ORAL_TABLET | Freq: Every day | ORAL | 3 refills | Status: DC | PRN
Start: 2022-10-05 — End: 2023-08-02
  Filled 2023-04-21 (×2): qty 30, 30d supply, fill #0
  Filled 2023-05-26: qty 30, 30d supply, fill #1
  Filled 2023-07-01: qty 30, 30d supply, fill #2

## 2022-11-15 MED ORDER — DONEPEZIL HCL 5 MG PO TABS
5.0000 mg | ORAL_TABLET | Freq: Every evening | ORAL | 2 refills | Status: DC
Start: 2022-09-20 — End: 2022-12-14
  Filled 2022-11-15: qty 30, 30d supply, fill #0

## 2022-11-15 MED ORDER — OXYCODONE-ACETAMINOPHEN 7.5-325 MG PO TABS
1.0000 | ORAL_TABLET | Freq: Two times a day (BID) | ORAL | 0 refills | Status: DC
  Filled 2022-11-15: qty 60, 30d supply, fill #0

## 2022-11-15 MED ORDER — MELOXICAM 7.5 MG PO TABS
7.5000 mg | ORAL_TABLET | Freq: Every day | ORAL | 3 refills | Status: DC | PRN
Start: 2022-10-05 — End: 2023-08-02
  Filled 2022-11-15: qty 30, 30d supply, fill #0

## 2022-11-15 MED FILL — Gabapentin Cap 300 MG: ORAL | 30 days supply | Qty: 60 | Fill #0 | Status: AC

## 2022-11-15 MED FILL — Aripiprazole Tab 2 MG: ORAL | 30 days supply | Qty: 30 | Fill #0 | Status: AC

## 2022-11-16 DIAGNOSIS — M810 Age-related osteoporosis without current pathological fracture: Secondary | ICD-10-CM | POA: Diagnosis not present

## 2022-11-16 DIAGNOSIS — M21372 Foot drop, left foot: Secondary | ICD-10-CM | POA: Diagnosis not present

## 2022-11-16 DIAGNOSIS — M419 Scoliosis, unspecified: Secondary | ICD-10-CM | POA: Diagnosis not present

## 2022-11-16 DIAGNOSIS — F3342 Major depressive disorder, recurrent, in full remission: Secondary | ICD-10-CM | POA: Diagnosis not present

## 2022-11-16 DIAGNOSIS — G301 Alzheimer's disease with late onset: Secondary | ICD-10-CM | POA: Diagnosis not present

## 2022-11-16 DIAGNOSIS — F02A3 Dementia in other diseases classified elsewhere, mild, with mood disturbance: Secondary | ICD-10-CM | POA: Diagnosis not present

## 2022-11-17 ENCOUNTER — Other Ambulatory Visit (HOSPITAL_COMMUNITY): Payer: Self-pay

## 2022-11-17 DIAGNOSIS — F02A3 Dementia in other diseases classified elsewhere, mild, with mood disturbance: Secondary | ICD-10-CM | POA: Diagnosis not present

## 2022-11-17 DIAGNOSIS — M21372 Foot drop, left foot: Secondary | ICD-10-CM | POA: Diagnosis not present

## 2022-11-17 DIAGNOSIS — G301 Alzheimer's disease with late onset: Secondary | ICD-10-CM | POA: Diagnosis not present

## 2022-11-17 DIAGNOSIS — M810 Age-related osteoporosis without current pathological fracture: Secondary | ICD-10-CM | POA: Diagnosis not present

## 2022-11-17 DIAGNOSIS — F3342 Major depressive disorder, recurrent, in full remission: Secondary | ICD-10-CM | POA: Diagnosis not present

## 2022-11-17 DIAGNOSIS — M419 Scoliosis, unspecified: Secondary | ICD-10-CM | POA: Diagnosis not present

## 2022-11-18 DIAGNOSIS — M810 Age-related osteoporosis without current pathological fracture: Secondary | ICD-10-CM | POA: Diagnosis not present

## 2022-11-18 DIAGNOSIS — M419 Scoliosis, unspecified: Secondary | ICD-10-CM | POA: Diagnosis not present

## 2022-11-18 DIAGNOSIS — M21372 Foot drop, left foot: Secondary | ICD-10-CM | POA: Diagnosis not present

## 2022-11-18 DIAGNOSIS — F3342 Major depressive disorder, recurrent, in full remission: Secondary | ICD-10-CM | POA: Diagnosis not present

## 2022-11-18 DIAGNOSIS — F02A3 Dementia in other diseases classified elsewhere, mild, with mood disturbance: Secondary | ICD-10-CM | POA: Diagnosis not present

## 2022-11-18 DIAGNOSIS — G301 Alzheimer's disease with late onset: Secondary | ICD-10-CM | POA: Diagnosis not present

## 2022-11-23 DIAGNOSIS — M21372 Foot drop, left foot: Secondary | ICD-10-CM | POA: Diagnosis not present

## 2022-11-23 DIAGNOSIS — F02A3 Dementia in other diseases classified elsewhere, mild, with mood disturbance: Secondary | ICD-10-CM | POA: Diagnosis not present

## 2022-11-23 DIAGNOSIS — M810 Age-related osteoporosis without current pathological fracture: Secondary | ICD-10-CM | POA: Diagnosis not present

## 2022-11-23 DIAGNOSIS — M419 Scoliosis, unspecified: Secondary | ICD-10-CM | POA: Diagnosis not present

## 2022-11-23 DIAGNOSIS — F3342 Major depressive disorder, recurrent, in full remission: Secondary | ICD-10-CM | POA: Diagnosis not present

## 2022-11-23 DIAGNOSIS — G301 Alzheimer's disease with late onset: Secondary | ICD-10-CM | POA: Diagnosis not present

## 2022-11-25 ENCOUNTER — Ambulatory Visit: Payer: Medicare Other | Admitting: Internal Medicine

## 2022-11-26 ENCOUNTER — Telehealth: Payer: Medicare Other | Admitting: Internal Medicine

## 2022-11-26 DIAGNOSIS — M25552 Pain in left hip: Secondary | ICD-10-CM | POA: Insufficient documentation

## 2022-11-26 DIAGNOSIS — M21372 Foot drop, left foot: Secondary | ICD-10-CM | POA: Diagnosis not present

## 2022-11-26 DIAGNOSIS — M545 Low back pain, unspecified: Secondary | ICD-10-CM | POA: Diagnosis not present

## 2022-11-26 DIAGNOSIS — M810 Age-related osteoporosis without current pathological fracture: Secondary | ICD-10-CM | POA: Diagnosis not present

## 2022-11-26 DIAGNOSIS — G8929 Other chronic pain: Secondary | ICD-10-CM

## 2022-11-26 DIAGNOSIS — G301 Alzheimer's disease with late onset: Secondary | ICD-10-CM | POA: Diagnosis not present

## 2022-11-26 DIAGNOSIS — F02A3 Dementia in other diseases classified elsewhere, mild, with mood disturbance: Secondary | ICD-10-CM | POA: Diagnosis not present

## 2022-11-26 DIAGNOSIS — F3342 Major depressive disorder, recurrent, in full remission: Secondary | ICD-10-CM | POA: Diagnosis not present

## 2022-11-26 DIAGNOSIS — M419 Scoliosis, unspecified: Secondary | ICD-10-CM | POA: Diagnosis not present

## 2022-11-26 NOTE — Progress Notes (Signed)
   Office Visit  Subjective   Patient ID: Andrew Poole   DOB: [birth year]   Age: 87 y.o.   MRN: [id number]   Chief Complaint Left hip pain , hypertension and follow up.  History of Present Illness 87 years old male who has moved to [place] independent living to his apartment.  I have a telehealth visit with him today.  His daughter was also present during that meeting.  He says that he is doing better.  He still have lower back and left hip pain.  He describes this pain as a mild as he takes oxycodone, meloxicam and gabapentin.  He walks with a walker when he goes out.  He is able to manage himself.  His pain is not suggested of any fracture. He says that he is eating good.  Occupational Therapy is following him there also.  Past Medical History No past medical history on file.   Allergies No Known Allergies   Review of Systems Review of Systems  Constitutional: Negative.   Cardiovascular: Negative.   Musculoskeletal:  Positive for back pain and joint pain.       Objective:    Vitals There were no vitals taken for this visit.   Physical Examination Physical Exam  As this is a telehealth visit so no physical exam was done.   Assessment & Plan:   Back pain Continue with current medication as that is helping him.  Left hip pain I will continue to monitor his back pain and left hip pain.  As medicine is helping so I will monitor.    Return in about 1 month (around 12/27/2022).   Eloisa Northern, MD

## 2022-11-26 NOTE — Assessment & Plan Note (Signed)
I will continue to monitor his back pain and left hip pain.  As medicine is helping so I will monitor.

## 2022-11-26 NOTE — Assessment & Plan Note (Signed)
Continue with current medication as that is helping him.

## 2022-11-28 ENCOUNTER — Other Ambulatory Visit: Payer: Self-pay | Admitting: Internal Medicine

## 2022-11-29 DIAGNOSIS — F02A3 Dementia in other diseases classified elsewhere, mild, with mood disturbance: Secondary | ICD-10-CM | POA: Diagnosis not present

## 2022-11-29 DIAGNOSIS — M21372 Foot drop, left foot: Secondary | ICD-10-CM | POA: Diagnosis not present

## 2022-11-29 DIAGNOSIS — G301 Alzheimer's disease with late onset: Secondary | ICD-10-CM | POA: Diagnosis not present

## 2022-11-29 DIAGNOSIS — M419 Scoliosis, unspecified: Secondary | ICD-10-CM | POA: Diagnosis not present

## 2022-11-29 DIAGNOSIS — F3342 Major depressive disorder, recurrent, in full remission: Secondary | ICD-10-CM | POA: Diagnosis not present

## 2022-11-29 DIAGNOSIS — M810 Age-related osteoporosis without current pathological fracture: Secondary | ICD-10-CM | POA: Diagnosis not present

## 2022-12-01 DIAGNOSIS — Z7982 Long term (current) use of aspirin: Secondary | ICD-10-CM | POA: Diagnosis not present

## 2022-12-01 DIAGNOSIS — F3342 Major depressive disorder, recurrent, in full remission: Secondary | ICD-10-CM | POA: Diagnosis not present

## 2022-12-01 DIAGNOSIS — F02A3 Dementia in other diseases classified elsewhere, mild, with mood disturbance: Secondary | ICD-10-CM | POA: Diagnosis not present

## 2022-12-01 DIAGNOSIS — M419 Scoliosis, unspecified: Secondary | ICD-10-CM | POA: Diagnosis not present

## 2022-12-01 DIAGNOSIS — E785 Hyperlipidemia, unspecified: Secondary | ICD-10-CM | POA: Diagnosis not present

## 2022-12-01 DIAGNOSIS — M810 Age-related osteoporosis without current pathological fracture: Secondary | ICD-10-CM | POA: Diagnosis not present

## 2022-12-01 DIAGNOSIS — Z9181 History of falling: Secondary | ICD-10-CM | POA: Diagnosis not present

## 2022-12-01 DIAGNOSIS — M21372 Foot drop, left foot: Secondary | ICD-10-CM | POA: Diagnosis not present

## 2022-12-01 DIAGNOSIS — I10 Essential (primary) hypertension: Secondary | ICD-10-CM | POA: Diagnosis not present

## 2022-12-01 DIAGNOSIS — G301 Alzheimer's disease with late onset: Secondary | ICD-10-CM | POA: Diagnosis not present

## 2022-12-02 DIAGNOSIS — G301 Alzheimer's disease with late onset: Secondary | ICD-10-CM | POA: Diagnosis not present

## 2022-12-02 DIAGNOSIS — F02A3 Dementia in other diseases classified elsewhere, mild, with mood disturbance: Secondary | ICD-10-CM | POA: Diagnosis not present

## 2022-12-02 DIAGNOSIS — F3342 Major depressive disorder, recurrent, in full remission: Secondary | ICD-10-CM | POA: Diagnosis not present

## 2022-12-02 DIAGNOSIS — M810 Age-related osteoporosis without current pathological fracture: Secondary | ICD-10-CM | POA: Diagnosis not present

## 2022-12-02 DIAGNOSIS — M21372 Foot drop, left foot: Secondary | ICD-10-CM | POA: Diagnosis not present

## 2022-12-02 DIAGNOSIS — M419 Scoliosis, unspecified: Secondary | ICD-10-CM | POA: Diagnosis not present

## 2022-12-03 DIAGNOSIS — M810 Age-related osteoporosis without current pathological fracture: Secondary | ICD-10-CM | POA: Diagnosis not present

## 2022-12-03 DIAGNOSIS — G301 Alzheimer's disease with late onset: Secondary | ICD-10-CM | POA: Diagnosis not present

## 2022-12-03 DIAGNOSIS — F02A3 Dementia in other diseases classified elsewhere, mild, with mood disturbance: Secondary | ICD-10-CM | POA: Diagnosis not present

## 2022-12-03 DIAGNOSIS — M21372 Foot drop, left foot: Secondary | ICD-10-CM | POA: Diagnosis not present

## 2022-12-03 DIAGNOSIS — M419 Scoliosis, unspecified: Secondary | ICD-10-CM | POA: Diagnosis not present

## 2022-12-03 DIAGNOSIS — F3342 Major depressive disorder, recurrent, in full remission: Secondary | ICD-10-CM | POA: Diagnosis not present

## 2022-12-10 DIAGNOSIS — M21372 Foot drop, left foot: Secondary | ICD-10-CM | POA: Diagnosis not present

## 2022-12-10 DIAGNOSIS — G301 Alzheimer's disease with late onset: Secondary | ICD-10-CM | POA: Diagnosis not present

## 2022-12-10 DIAGNOSIS — M419 Scoliosis, unspecified: Secondary | ICD-10-CM | POA: Diagnosis not present

## 2022-12-10 DIAGNOSIS — F02A3 Dementia in other diseases classified elsewhere, mild, with mood disturbance: Secondary | ICD-10-CM | POA: Diagnosis not present

## 2022-12-10 DIAGNOSIS — M810 Age-related osteoporosis without current pathological fracture: Secondary | ICD-10-CM | POA: Diagnosis not present

## 2022-12-10 DIAGNOSIS — F3342 Major depressive disorder, recurrent, in full remission: Secondary | ICD-10-CM | POA: Diagnosis not present

## 2022-12-14 ENCOUNTER — Other Ambulatory Visit: Payer: Self-pay | Admitting: Internal Medicine

## 2022-12-14 ENCOUNTER — Other Ambulatory Visit: Payer: Self-pay

## 2022-12-14 ENCOUNTER — Other Ambulatory Visit (HOSPITAL_COMMUNITY): Payer: Self-pay

## 2022-12-14 MED FILL — Gabapentin Cap 300 MG: ORAL | 30 days supply | Qty: 60 | Fill #1 | Status: AC

## 2022-12-14 MED FILL — Gabapentin Cap 300 MG: ORAL | 30 days supply | Qty: 60 | Fill #1 | Status: CN

## 2022-12-20 ENCOUNTER — Other Ambulatory Visit: Payer: Self-pay | Admitting: Internal Medicine

## 2022-12-20 ENCOUNTER — Other Ambulatory Visit (HOSPITAL_COMMUNITY): Payer: Self-pay

## 2022-12-20 MED ORDER — ARIPIPRAZOLE 2 MG PO TABS
2.0000 mg | ORAL_TABLET | Freq: Every day | ORAL | 3 refills | Status: DC
  Filled 2022-12-20: qty 30, 30d supply, fill #0
  Filled 2023-01-14: qty 30, 30d supply, fill #1
  Filled 2023-02-15: qty 30, 30d supply, fill #2
  Filled 2023-03-17: qty 30, 30d supply, fill #3

## 2022-12-21 ENCOUNTER — Other Ambulatory Visit (HOSPITAL_COMMUNITY): Payer: Self-pay

## 2023-01-04 DIAGNOSIS — Z23 Encounter for immunization: Secondary | ICD-10-CM | POA: Diagnosis not present

## 2023-01-14 ENCOUNTER — Other Ambulatory Visit (HOSPITAL_COMMUNITY): Payer: Self-pay

## 2023-01-14 ENCOUNTER — Other Ambulatory Visit: Payer: Self-pay | Admitting: Internal Medicine

## 2023-01-14 DIAGNOSIS — I700 Atherosclerosis of aorta: Secondary | ICD-10-CM | POA: Diagnosis not present

## 2023-01-14 DIAGNOSIS — I2510 Atherosclerotic heart disease of native coronary artery without angina pectoris: Secondary | ICD-10-CM | POA: Diagnosis not present

## 2023-01-14 DIAGNOSIS — J479 Bronchiectasis, uncomplicated: Secondary | ICD-10-CM | POA: Diagnosis not present

## 2023-01-14 DIAGNOSIS — R918 Other nonspecific abnormal finding of lung field: Secondary | ICD-10-CM | POA: Diagnosis not present

## 2023-01-14 MED ORDER — GABAPENTIN 300 MG PO CAPS
300.0000 mg | ORAL_CAPSULE | Freq: Two times a day (BID) | ORAL | 2 refills | Status: DC
  Filled 2023-01-14: qty 60, 30d supply, fill #0
  Filled 2023-02-15: qty 60, 30d supply, fill #1
  Filled 2023-03-17: qty 60, 30d supply, fill #2

## 2023-01-14 MED ORDER — MEMANTINE HCL 5 MG PO TABS
10.0000 mg | ORAL_TABLET | Freq: Two times a day (BID) | ORAL | 3 refills | Status: DC
  Filled 2023-01-14 – 2023-01-17 (×2): qty 120, 30d supply, fill #0
  Filled 2023-02-15: qty 120, 30d supply, fill #1
  Filled 2023-03-17: qty 120, 30d supply, fill #2
  Filled 2023-04-20: qty 120, 30d supply, fill #3

## 2023-01-14 MED ORDER — OXYCODONE-ACETAMINOPHEN 5-325 MG PO TABS
1.0000 | ORAL_TABLET | Freq: Three times a day (TID) | ORAL | 0 refills | Status: DC | PRN
  Filled 2023-01-14: qty 60, 20d supply, fill #0

## 2023-01-14 MED ORDER — MEMANTINE HCL 5 MG PO TABS
5.0000 mg | ORAL_TABLET | Freq: Two times a day (BID) | ORAL | 1 refills | Status: DC
  Filled 2023-01-14: qty 60, 30d supply, fill #0

## 2023-01-14 MED FILL — Atorvastatin Calcium Tab 10 MG (Base Equivalent): ORAL | 90 days supply | Qty: 90 | Fill #0 | Status: AC

## 2023-01-17 ENCOUNTER — Other Ambulatory Visit (HOSPITAL_COMMUNITY): Payer: Self-pay

## 2023-01-17 ENCOUNTER — Other Ambulatory Visit: Payer: Self-pay | Admitting: Internal Medicine

## 2023-01-17 MED ORDER — AMLODIPINE BESYLATE 2.5 MG PO TABS
2.5000 mg | ORAL_TABLET | Freq: Every day | ORAL | 0 refills | Status: DC
  Filled 2023-01-17: qty 90, 90d supply, fill #0

## 2023-01-21 ENCOUNTER — Other Ambulatory Visit (HOSPITAL_COMMUNITY): Payer: Self-pay

## 2023-02-15 ENCOUNTER — Other Ambulatory Visit: Payer: Self-pay

## 2023-02-15 ENCOUNTER — Other Ambulatory Visit: Payer: Self-pay | Admitting: Internal Medicine

## 2023-02-15 ENCOUNTER — Other Ambulatory Visit (HOSPITAL_COMMUNITY): Payer: Self-pay

## 2023-02-16 ENCOUNTER — Other Ambulatory Visit (HOSPITAL_COMMUNITY): Payer: Self-pay

## 2023-02-16 ENCOUNTER — Other Ambulatory Visit: Payer: Self-pay | Admitting: Internal Medicine

## 2023-02-16 MED ORDER — OXYCODONE-ACETAMINOPHEN 5-325 MG PO TABS
1.0000 | ORAL_TABLET | Freq: Three times a day (TID) | ORAL | 0 refills | Status: DC | PRN
  Filled 2023-02-16: qty 60, 20d supply, fill #0

## 2023-02-18 ENCOUNTER — Other Ambulatory Visit (HOSPITAL_COMMUNITY): Payer: Self-pay

## 2023-03-17 ENCOUNTER — Other Ambulatory Visit (HOSPITAL_COMMUNITY): Payer: Self-pay

## 2023-03-18 ENCOUNTER — Other Ambulatory Visit (HOSPITAL_COMMUNITY): Payer: Self-pay

## 2023-04-20 ENCOUNTER — Other Ambulatory Visit: Payer: Self-pay | Admitting: Internal Medicine

## 2023-04-20 ENCOUNTER — Other Ambulatory Visit (HOSPITAL_COMMUNITY): Payer: Self-pay

## 2023-04-20 MED FILL — Atorvastatin Calcium Tab 10 MG (Base Equivalent): ORAL | 90 days supply | Qty: 90 | Fill #1 | Status: AC

## 2023-04-21 ENCOUNTER — Other Ambulatory Visit: Payer: Self-pay

## 2023-04-21 ENCOUNTER — Other Ambulatory Visit (HOSPITAL_COMMUNITY): Payer: Self-pay

## 2023-04-22 ENCOUNTER — Other Ambulatory Visit (HOSPITAL_COMMUNITY): Payer: Self-pay

## 2023-04-28 ENCOUNTER — Other Ambulatory Visit: Payer: Self-pay | Admitting: Internal Medicine

## 2023-04-28 ENCOUNTER — Other Ambulatory Visit (HOSPITAL_COMMUNITY): Payer: Self-pay

## 2023-04-28 ENCOUNTER — Other Ambulatory Visit: Payer: Self-pay

## 2023-04-29 ENCOUNTER — Other Ambulatory Visit: Payer: Self-pay

## 2023-04-29 ENCOUNTER — Other Ambulatory Visit (HOSPITAL_COMMUNITY): Payer: Self-pay

## 2023-04-29 MED ORDER — GABAPENTIN 300 MG PO CAPS
300.0000 mg | ORAL_CAPSULE | Freq: Two times a day (BID) | ORAL | 2 refills | Status: DC
  Filled 2023-04-29: qty 60, 30d supply, fill #0
  Filled 2023-06-21 – 2023-07-20 (×2): qty 60, 30d supply, fill #1
  Filled 2023-08-24: qty 60, 30d supply, fill #2

## 2023-04-29 MED ORDER — AMLODIPINE BESYLATE 2.5 MG PO TABS
2.5000 mg | ORAL_TABLET | Freq: Every day | ORAL | 0 refills | Status: DC
  Filled 2023-04-29: qty 90, 90d supply, fill #0

## 2023-04-29 MED ORDER — ARIPIPRAZOLE 2 MG PO TABS
2.0000 mg | ORAL_TABLET | Freq: Every day | ORAL | 3 refills | Status: DC
  Filled 2023-04-29: qty 30, 30d supply, fill #0
  Filled 2023-05-26: qty 30, 30d supply, fill #1
  Filled 2023-07-01: qty 30, 30d supply, fill #2
  Filled 2023-07-25: qty 30, 30d supply, fill #3

## 2023-05-02 ENCOUNTER — Other Ambulatory Visit (HOSPITAL_COMMUNITY): Payer: Self-pay

## 2023-05-02 ENCOUNTER — Other Ambulatory Visit: Payer: Self-pay | Admitting: Internal Medicine

## 2023-05-10 IMAGING — MR MR PROSTATE WO/W CM
12 series · 48 of 48 positions shown · IV contrast (multihance)
Comparison: None.

CLINICAL DATA: Elevated PSA.

EXAM:
MR PROSTATE WITHOUT AND WITH CONTRAST
TECHNIQUE: Multiplanar multisequence MRI images were obtained of the pelvis
centered about the prostate. Pre and post contrast images were
obtained.
CONTRAST:  14mL MULTIHANCE GADOBENATE DIMEGLUMINE 529 MG/ML IV SOLN

[Series 3: T2 · coronal · 3.0mm · 0.56mm/px · 1 of 23 slices shown (1 of 3)]
[im 1/23]
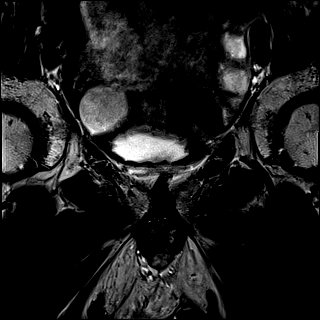

[Series 4: T1 · axial · 5.0mm · 1.25mm/px · 1 of 96 slices shown]
[im 1/96]
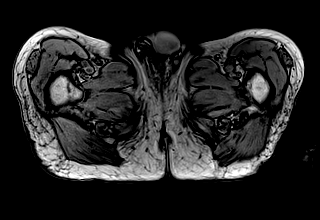

[Series 5: DWI · axial · 3.0mm · 1.75mm/px · z∈[-106,-25]mm · 2 of 84 slices shown (1 of 3)]
[im 1/84]
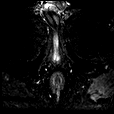
[im 84/84]
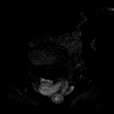

[Series 6: DWI · axial · 3.0mm · 1.75mm/px · 1 of 28 slices shown (2 of 3)]
[im 1/28]
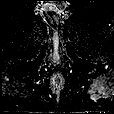

[Series 7: DWI · axial · 3.0mm · 1.75mm/px · 1 of 28 slices shown (3 of 3)]
[im 1/28]
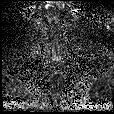

[Series 8: T2 · axial · 3.0mm · 0.56mm/px · 1 of 28 slices shown (2 of 3)]
[im 1/28]
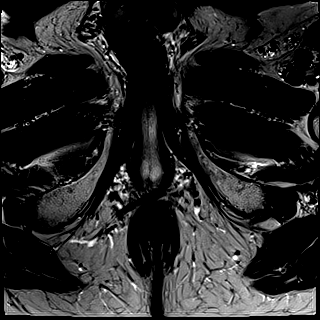

[Series 9: T2 · axial · 1.0mm · 1.04mm/px · z∈[-120,-33]mm · 2 of 88 slices shown (3 of 3)]
[im 1/88]
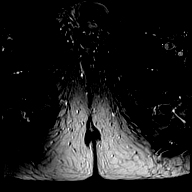
[im 88/88]
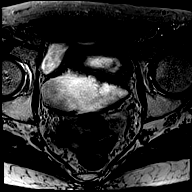

[Series 10: pre t1_twist_tra_dyn · axial · non-contrast · 3.5mm · 0.83mm/px · 1 of 22 slices shown]
[im 1/22]
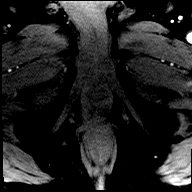

[Series 11: post t1_twist_tra_dyn-copy center · axial · non-contrast · 3.5mm · 0.83mm/px · z∈[-101,-28]mm · 17 of 660 slices shown]
[im 1/660]
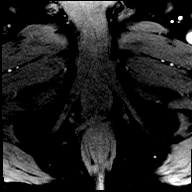
[im 42/660]
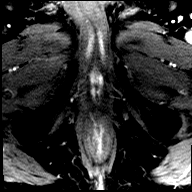
[im 83/660]
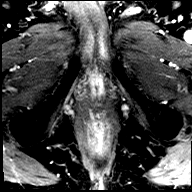
[im 124/660]
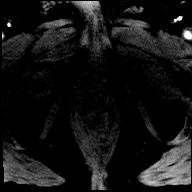
[im 165/660]
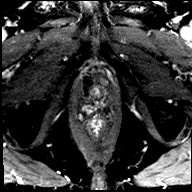
[im 206/660]
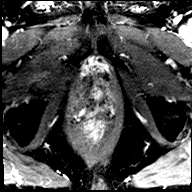
[im 248/660]
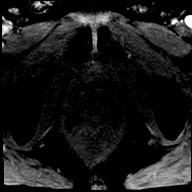
[im 289/660]
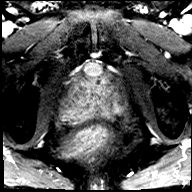
[im 330/660]
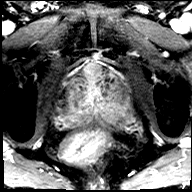
[im 371/660]
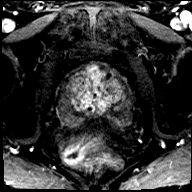
[im 412/660]
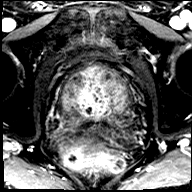
[im 454/660]
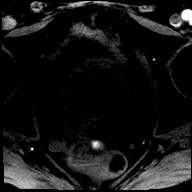
[im 495/660]
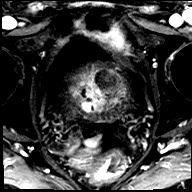
[im 536/660]
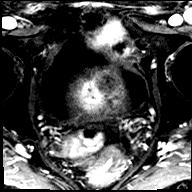
[im 577/660]
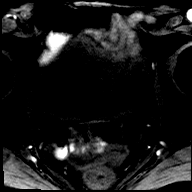
[im 618/660]
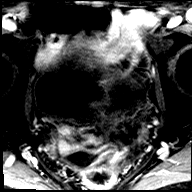
[im 660/660]
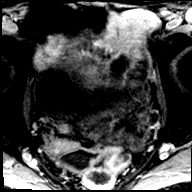

[Series 12: post t1_twist_tra_dyn-copy cent_sub · axial · 3.5mm · 0.83mm/px · z∈[-101,-28]mm · 17 of 638 slices shown]
[im 1/638]
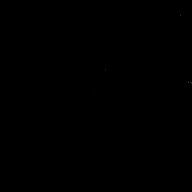
[im 40/638]
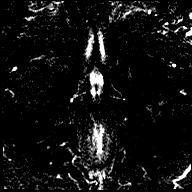
[im 80/638]
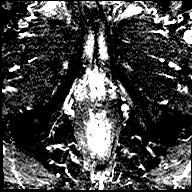
[im 120/638]
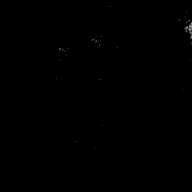
[im 160/638]
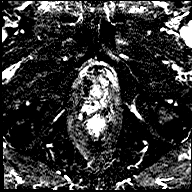
[im 200/638]
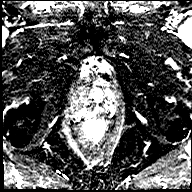
[im 239/638]
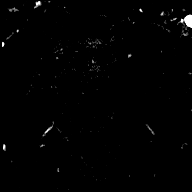
[im 279/638]
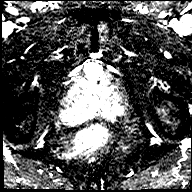
[im 319/638]
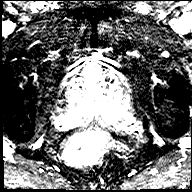
[im 359/638]
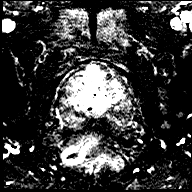
[im 399/638]
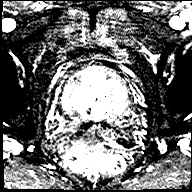
[im 438/638]
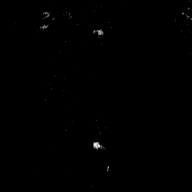
[im 478/638]
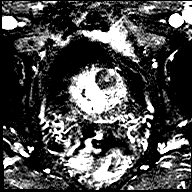
[im 518/638]
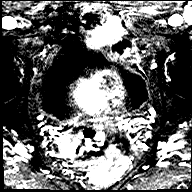
[im 558/638]
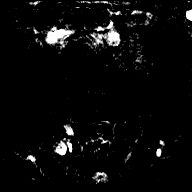
[im 598/638]
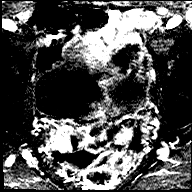
[im 638/638]
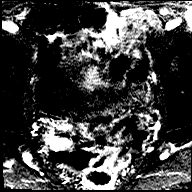

[Series 13: t1_vibe_dixon_tra_f · axial · 2.5mm · 0.91mm/px · z∈[-98,+99]mm · 2 of 80 slices shown]
[im 1/80]
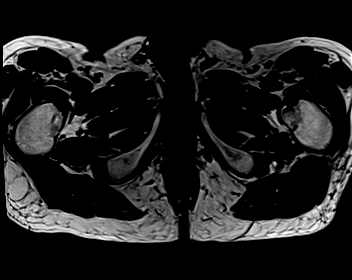
[im 80/80]
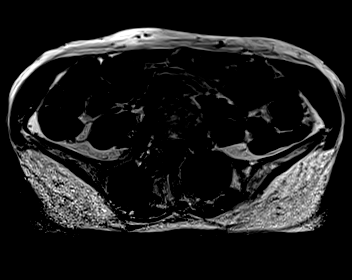

[Series 14: t1_vibe_dixon_tra_w · axial · 2.5mm · 0.91mm/px · z∈[-98,+99]mm · 2 of 80 slices shown]
[im 1/80]
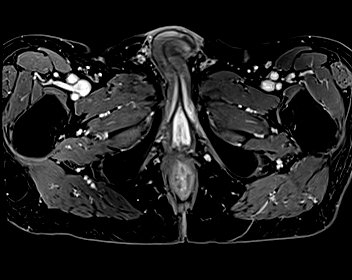
[im 80/80]
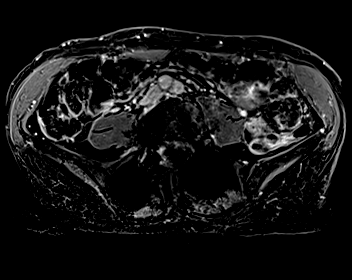

[48 of 48 positions shown; findings below may reference images not displayed]

FINDINGS: Prostate:

-- Peripheral Zone: Diffuse thinning is seen due to central gland
hypertrophy. Linear/wedge shaped hypointensities are noted on ADC. A
poorly defined T2 hypointense nodule is seen in the left
posteromedial apex which measures 1.4 x 1.0 cm on image [DATE]. This
shows marked ADC hypointensity, mild DWI hyperintensity, but no
early contrast enhancement.

-- Transition/Central Zone: Moderately enlarged with encapsulated
BPH nodules noted, largest in the anterior mid apex and anterior
left base; However, no suspicious nodules with obscured margins or
significantly restricted diffusion seen.

-- Measurements/Volume:  5.0 by 5.4 x 6.5 cm (volume = 92 cm^3)

Transcapsular spread:  Absent

Seminal vesicle involvement:  Absent

Neurovascular bundle involvement:  Absent

Pelvic adenopathy: None visualized

Bone metastasis: None visualized

Other:  Lumbar spine fusion hardware noted.
IMPRESSION: 1.4 cm peripheral zone nodule in the left posteromedial apex, which
is indeterminate. PI-RADS 3 (v2.1): Intermediate (clinically
significant cancer equivocal)

No evidence of extracapsular extension or pelvic metastatic disease.

(I have post-processed this exam in the DynaCAD application for
potential MR-US fusion-guided biopsy.)

## 2023-05-12 ENCOUNTER — Other Ambulatory Visit (HOSPITAL_COMMUNITY): Payer: Self-pay

## 2023-05-13 ENCOUNTER — Telehealth: Payer: Medicare Other | Admitting: Internal Medicine

## 2023-05-13 ENCOUNTER — Other Ambulatory Visit (HOSPITAL_COMMUNITY): Payer: Self-pay

## 2023-05-16 ENCOUNTER — Telehealth: Payer: Medicare Other | Admitting: Internal Medicine

## 2023-05-17 ENCOUNTER — Ambulatory Visit: Payer: Medicare Other | Admitting: Internal Medicine

## 2023-05-17 ENCOUNTER — Encounter: Payer: Self-pay | Admitting: Internal Medicine

## 2023-05-17 ENCOUNTER — Other Ambulatory Visit: Payer: Self-pay

## 2023-05-17 ENCOUNTER — Other Ambulatory Visit (HOSPITAL_COMMUNITY): Payer: Self-pay

## 2023-05-17 VITALS — BP 116/78 | HR 68 | Temp 98.0°F | Resp 18 | Ht 71.0 in | Wt 168.2 lb

## 2023-05-17 DIAGNOSIS — F3342 Major depressive disorder, recurrent, in full remission: Secondary | ICD-10-CM

## 2023-05-17 DIAGNOSIS — R262 Difficulty in walking, not elsewhere classified: Secondary | ICD-10-CM | POA: Diagnosis not present

## 2023-05-17 DIAGNOSIS — M545 Low back pain, unspecified: Secondary | ICD-10-CM | POA: Diagnosis not present

## 2023-05-17 DIAGNOSIS — Z131 Encounter for screening for diabetes mellitus: Secondary | ICD-10-CM | POA: Diagnosis not present

## 2023-05-17 DIAGNOSIS — E785 Hyperlipidemia, unspecified: Secondary | ICD-10-CM

## 2023-05-17 DIAGNOSIS — G301 Alzheimer's disease with late onset: Secondary | ICD-10-CM | POA: Diagnosis not present

## 2023-05-17 DIAGNOSIS — G8929 Other chronic pain: Secondary | ICD-10-CM | POA: Diagnosis not present

## 2023-05-17 DIAGNOSIS — M810 Age-related osteoporosis without current pathological fracture: Secondary | ICD-10-CM | POA: Diagnosis not present

## 2023-05-17 DIAGNOSIS — I10 Essential (primary) hypertension: Secondary | ICD-10-CM | POA: Diagnosis not present

## 2023-05-17 MED ORDER — OXYCODONE-ACETAMINOPHEN 5-325 MG PO TABS
1.0000 | ORAL_TABLET | Freq: Three times a day (TID) | ORAL | 0 refills | Status: DC | PRN
  Filled 2023-05-17: qty 60, 20d supply, fill #0

## 2023-05-17 MED ORDER — MEMANTINE HCL 10 MG PO TABS
10.0000 mg | ORAL_TABLET | Freq: Two times a day (BID) | ORAL | 6 refills | Status: DC
  Filled 2023-05-17: qty 60, 30d supply, fill #0
  Filled 2023-06-21: qty 60, 30d supply, fill #1
  Filled 2023-07-20: qty 60, 30d supply, fill #2
  Filled 2023-08-24: qty 60, 30d supply, fill #3
  Filled 2023-10-10: qty 60, 30d supply, fill #4
  Filled 2023-11-09: qty 60, 30d supply, fill #5
  Filled 2023-12-05: qty 60, 30d supply, fill #6

## 2023-05-17 NOTE — Assessment & Plan Note (Signed)
 stable

## 2023-05-17 NOTE — Progress Notes (Signed)
   Office Visit  Subjective   Patient ID: Andrew Poole   DOB: Dec 07, 1935   Age: 88 y.o.   MRN: [id number]   Chief Complaint Chief Complaint  Patient presents with   Medication Refill    Follow up     History of Present Illness 88 years old male who is here for follow up. He has moved to independent living in [place] .  He has gained 6 pounds since last visit.  He feels much better.  He walks with a rollator and no fall within last 1 year.  He says that his depression is better and he is sleeping good.  He takes sertraline  and Abilify  2 mg daily.  No side effect from this medication.   He has hypertension and his BP is well controlled with amlodipine  2.5 mg daily.   He also has dyslipidemia and take atorvastatin  10 mg daily.  His last 3 visits were via videoconference.  He is here physically so we will do lipid panel as well.      He has chronic left side low back pain that does not radiate to left leg any more. He take gabapentin  and percocet but he says that he only take this medicine when he really needed it.  He take meloxicam  as needed as well.  He says that he does not hurt unless he does something more activity related.    He also has osteoprosis but have not started any treatment.   Past Medical History No past medical history on file.   Allergies No Known Allergies   Review of Systems Review of Systems  Constitutional: Negative.   HENT: Negative.    Eyes:  Positive for blurred vision.  Respiratory: Negative.    Cardiovascular: Negative.   Gastrointestinal: Negative.   Genitourinary: Negative.   Neurological: Negative.        Objective:    Vitals BP 116/78 (BP Location: Left Arm, Patient Position: Sitting, Cuff Size: Normal)   Pulse 68   Temp 98 F (36.7 C)   Resp 18   Ht 5' 11 (1.803 m)   Wt 168 lb 4 oz (76.3 kg)   SpO2 99%   BMI 23.47 kg/m    Physical Examination Physical Exam Constitutional:      Appearance: Normal appearance.  HENT:      Head: Normocephalic and atraumatic.  Cardiovascular:     Rate and Rhythm: Normal rate and regular rhythm.     Heart sounds: Normal heart sounds.  Pulmonary:     Effort: Pulmonary effort is normal.     Breath sounds: Normal breath sounds.  Abdominal:     General: Bowel sounds are normal.     Palpations: Abdomen is soft.  Neurological:     General: No focal deficit present.     Mental Status: He is alert and oriented to person, place, and time.        Assessment & Plan:   Hypertension controlled  Mild late onset Alzheimer's dementia without behavioral disturbance, psychotic disturbance, mood disturbance, or anxiety (HCC) stable  Osteoporosis He is not taking any medications and he will do weight related exercises.   Ambulatory dysfunction He walk with rolator, no fall within last 1 year  Back pain He takes percocet and gabapentin     Return in about 6 months (around 11/14/2023).   Roetta Dare, MD

## 2023-05-17 NOTE — Assessment & Plan Note (Signed)
He walk with rolator, no fall within last 1 year

## 2023-05-17 NOTE — Assessment & Plan Note (Signed)
 controlled

## 2023-05-17 NOTE — Assessment & Plan Note (Signed)
He is not taking any medications and he will do weight related exercises.

## 2023-05-17 NOTE — Assessment & Plan Note (Signed)
He takes percocet and gabapentin

## 2023-05-18 LAB — CMP14 + ANION GAP
ALT: 15 [IU]/L (ref 0–44)
AST: 17 [IU]/L (ref 0–40)
Albumin: 4.3 g/dL (ref 3.7–4.7)
Alkaline Phosphatase: 59 [IU]/L (ref 44–121)
Anion Gap: 14 mmol/L (ref 10.0–18.0)
BUN/Creatinine Ratio: 32 — ABNORMAL HIGH (ref 10–24)
BUN: 29 mg/dL — ABNORMAL HIGH (ref 8–27)
Bilirubin Total: 0.4 mg/dL (ref 0.0–1.2)
CO2: 26 mmol/L (ref 20–29)
Calcium: 10 mg/dL (ref 8.6–10.2)
Chloride: 104 mmol/L (ref 96–106)
Creatinine, Ser: 0.9 mg/dL (ref 0.76–1.27)
Globulin, Total: 2.1 g/dL (ref 1.5–4.5)
Glucose: 99 mg/dL (ref 70–99)
Potassium: 5 mmol/L (ref 3.5–5.2)
Sodium: 144 mmol/L (ref 134–144)
Total Protein: 6.4 g/dL (ref 6.0–8.5)
eGFR: 84 mL/min/{1.73_m2} (ref >59)

## 2023-05-18 LAB — LIPID PANEL
Chol/HDL Ratio: 2.2 {ratio} (ref 0.0–5.0)
Cholesterol, Total: 162 mg/dL (ref 100–199)
HDL: 74 mg/dL (ref >39)
LDL Chol Calc (NIH): 70 mg/dL (ref 0–99)
Triglycerides: 100 mg/dL (ref 0–149)
VLDL Cholesterol Cal: 18 mg/dL (ref 5–40)

## 2023-05-18 LAB — HEMOGLOBIN A1C
Est. average glucose Bld gHb Est-mCnc: 105 mg/dL
Hgb A1c MFr Bld: 5.3 % (ref 4.8–5.6)

## 2023-05-20 ENCOUNTER — Other Ambulatory Visit: Payer: Self-pay | Admitting: Internal Medicine

## 2023-05-20 NOTE — Progress Notes (Signed)
 I have spoken with Mr. Andrew Poole daughter about his lab results.  He is doing better.  His lab results are also normal.

## 2023-05-26 ENCOUNTER — Other Ambulatory Visit (HOSPITAL_COMMUNITY): Payer: Self-pay

## 2023-05-26 ENCOUNTER — Other Ambulatory Visit: Payer: Self-pay

## 2023-05-26 ENCOUNTER — Other Ambulatory Visit: Payer: Self-pay | Admitting: Internal Medicine

## 2023-05-26 MED ORDER — SERTRALINE HCL 50 MG PO TABS
50.0000 mg | ORAL_TABLET | Freq: Every day | ORAL | 1 refills | Status: DC
  Filled 2023-05-26: qty 90, 90d supply, fill #0
  Filled 2023-08-24: qty 90, 90d supply, fill #1

## 2023-05-31 ENCOUNTER — Other Ambulatory Visit (HOSPITAL_COMMUNITY): Payer: Self-pay

## 2023-06-21 ENCOUNTER — Other Ambulatory Visit: Payer: Self-pay | Admitting: Internal Medicine

## 2023-06-21 ENCOUNTER — Other Ambulatory Visit (HOSPITAL_COMMUNITY): Payer: Self-pay

## 2023-06-21 MED ORDER — ATORVASTATIN CALCIUM 10 MG PO TABS
10.0000 mg | ORAL_TABLET | Freq: Every day | ORAL | 3 refills | Status: DC
  Filled 2023-06-21 – 2023-07-20 (×2): qty 90, 90d supply, fill #0
  Filled 2023-10-10: qty 90, 90d supply, fill #1
  Filled 2024-01-11: qty 90, 90d supply, fill #2
  Filled 2024-04-10: qty 90, 90d supply, fill #3

## 2023-06-21 MED ORDER — AMLODIPINE BESYLATE 2.5 MG PO TABS
2.5000 mg | ORAL_TABLET | Freq: Every day | ORAL | 0 refills | Status: DC
  Filled 2023-06-21 – 2023-07-25 (×2): qty 90, 90d supply, fill #0

## 2023-06-25 ENCOUNTER — Other Ambulatory Visit (HOSPITAL_COMMUNITY): Payer: Self-pay

## 2023-07-01 ENCOUNTER — Other Ambulatory Visit (HOSPITAL_COMMUNITY): Payer: Self-pay

## 2023-07-01 ENCOUNTER — Other Ambulatory Visit: Payer: Self-pay | Admitting: Internal Medicine

## 2023-07-08 ENCOUNTER — Other Ambulatory Visit (HOSPITAL_COMMUNITY): Payer: Self-pay

## 2023-07-08 ENCOUNTER — Other Ambulatory Visit: Payer: Self-pay | Admitting: Internal Medicine

## 2023-07-08 ENCOUNTER — Other Ambulatory Visit: Payer: Self-pay

## 2023-07-08 MED ORDER — OXYCODONE-ACETAMINOPHEN 5-325 MG PO TABS
1.0000 | ORAL_TABLET | Freq: Three times a day (TID) | ORAL | 0 refills | Status: DC | PRN
  Filled 2023-07-08: qty 60, 20d supply, fill #0

## 2023-07-18 DIAGNOSIS — H26491 Other secondary cataract, right eye: Secondary | ICD-10-CM | POA: Diagnosis not present

## 2023-07-18 DIAGNOSIS — H5203 Hypermetropia, bilateral: Secondary | ICD-10-CM | POA: Diagnosis not present

## 2023-07-20 ENCOUNTER — Other Ambulatory Visit (HOSPITAL_COMMUNITY): Payer: Self-pay

## 2023-07-25 ENCOUNTER — Other Ambulatory Visit: Payer: Self-pay

## 2023-07-25 ENCOUNTER — Other Ambulatory Visit (HOSPITAL_COMMUNITY): Payer: Self-pay

## 2023-08-02 ENCOUNTER — Encounter: Payer: Self-pay | Admitting: Internal Medicine

## 2023-08-02 ENCOUNTER — Other Ambulatory Visit (HOSPITAL_COMMUNITY): Payer: Self-pay

## 2023-08-02 ENCOUNTER — Ambulatory Visit: Payer: Medicare Other | Admitting: Internal Medicine

## 2023-08-02 VITALS — BP 122/80 | HR 71 | Temp 97.5°F | Resp 18 | Ht 71.0 in | Wt 185.5 lb

## 2023-08-02 DIAGNOSIS — F3342 Major depressive disorder, recurrent, in full remission: Secondary | ICD-10-CM

## 2023-08-02 DIAGNOSIS — M6281 Muscle weakness (generalized): Secondary | ICD-10-CM | POA: Insufficient documentation

## 2023-08-02 DIAGNOSIS — R600 Localized edema: Secondary | ICD-10-CM | POA: Insufficient documentation

## 2023-08-02 DIAGNOSIS — M810 Age-related osteoporosis without current pathological fracture: Secondary | ICD-10-CM

## 2023-08-02 DIAGNOSIS — Z0000 Encounter for general adult medical examination without abnormal findings: Secondary | ICD-10-CM | POA: Diagnosis not present

## 2023-08-02 DIAGNOSIS — E785 Hyperlipidemia, unspecified: Secondary | ICD-10-CM

## 2023-08-02 DIAGNOSIS — I10 Essential (primary) hypertension: Secondary | ICD-10-CM

## 2023-08-02 DIAGNOSIS — Z6825 Body mass index (BMI) 25.0-25.9, adult: Secondary | ICD-10-CM

## 2023-08-02 DIAGNOSIS — F02A0 Dementia in other diseases classified elsewhere, mild, without behavioral disturbance, psychotic disturbance, mood disturbance, and anxiety: Secondary | ICD-10-CM

## 2023-08-02 DIAGNOSIS — E559 Vitamin D deficiency, unspecified: Secondary | ICD-10-CM | POA: Insufficient documentation

## 2023-08-02 DIAGNOSIS — G301 Alzheimer's disease with late onset: Secondary | ICD-10-CM | POA: Diagnosis not present

## 2023-08-02 MED ORDER — FUROSEMIDE 20 MG PO TABS
20.0000 mg | ORAL_TABLET | Freq: Every day | ORAL | 1 refills | Status: DC
  Filled 2023-08-02: qty 30, 30d supply, fill #0
  Filled 2023-09-19 (×2): qty 30, 30d supply, fill #1

## 2023-08-02 MED ORDER — VITAMIN D3 125 MCG (5000 UT) PO CAPS
1.0000 | ORAL_CAPSULE | Freq: Every day | ORAL | 3 refills | Status: DC
  Filled 2023-08-02: qty 30, 30d supply, fill #0
  Filled 2023-11-02: qty 30, 30d supply, fill #1
  Filled 2023-12-14: qty 30, 30d supply, fill #2

## 2023-08-02 NOTE — Assessment & Plan Note (Signed)
 His LDL is target controlled and he take atorvastatin  10 mg without any side effect.

## 2023-08-02 NOTE — Progress Notes (Signed)
 Office Visit  Subjective   Patient ID: Andrew Poole   DOB: 13-Jun-1935   Age: 88 y.o.   MRN: [id number]   Chief Complaint Chief Complaint  Patient presents with   Annual Exam    MEDICARE AWV     History of Present Illness 88 years old male is here for annual wellness examination. [place] lives [place] at [facility]. He walk with rolator. No fall within last one year per patient. He feel that he is getting little forgetful. He score 27/30 on MMSE. He is not depress. He can dress yourself, he can bath himself. He can make coffee. He goes down to dinning area.  He quit smoking in 1977. He drink one shot of whisky every couple of days.   He has flu shot this year. He also has pneumonia and shingle vaccine. He also has COVID vaccines and booster. His last tetanus shot was less than 10 years ago.  He has diagnosis of osteoporosis and was seen by endocrinologist few years ago and at that time testosterone  injection was started.  His testosterone  level was 520 last year.  He does take vitamin D  daily.  He tells me that he worried that he may fall but has no fall within last 1 year.  He has difficulty getting up from chair.  He has history of depression and he takes sertraline  50 mg and aripiprazole  2 mg daily.  He feels that he is more relaxed and does not feel that he is depressed.  He scored 0 on PHQ 2 today.  He still has a history of mild depression and says that he does not remember things that he used too.  He did take memantine  10 mg twice a day.  He scored 27 out of 30 on Mini-Mental status examination today with 3 word recall was 3 out of 3.  His clock draw was very good.  He has hypertension and take amlodipine  2.5 mg daily and his blood pressure is well-controlled.  He also has hyperlipidemia and take atorvastatin  10 mg daily.  His blood test done 2 months ago shows LDL of target control and his liver and kidney function were normal.  He has chronic back pain status post surgery  in the past and also has mild scoliosis.  He take gabapentin  300 mg twice a day and oxycodone  5/325 mg as needed.  He tells me that his pain is much better and some days he only use 1 tablet for pain.  Past Medical History No past medical history on file.   Allergies No Known Allergies   Review of Systems Review of Systems  Constitutional: Negative.   HENT: Negative.    Respiratory: Negative.    Cardiovascular: Negative.   Gastrointestinal: Negative.   Musculoskeletal:  Positive for back pain.  Neurological: Negative.   Psychiatric/Behavioral:  Positive for memory loss.        Objective:    Vitals BP 122/80 (BP Location: Left Arm, Patient Position: Sitting, Cuff Size: Normal)   Pulse 71   Temp (!) 97.5 F (36.4 C)   Resp 18   Ht 5\' 11"  (1.803 m)   Wt 185 lb 8 oz (84.1 kg)   SpO2 99%   BMI 25.87 kg/m    Physical Examination Physical Exam Constitutional:      Appearance: Normal appearance.  HENT:     Head: Normocephalic and atraumatic.  Cardiovascular:     Rate and Rhythm: Normal rate and regular rhythm.  Heart sounds: Normal heart sounds.  Pulmonary:     Effort: Pulmonary effort is normal.     Breath sounds: Normal breath sounds.  Abdominal:     General: Bowel sounds are normal.     Palpations: Abdomen is soft.  Musculoskeletal:     Right lower leg: Edema present.     Left lower leg: Edema present.  Neurological:     General: No focal deficit present.     Mental Status: He is alert and oriented to person, place, and time.        Assessment & Plan:   Hypertension His blood pressure is controlled.  Mild late onset Alzheimer's dementia without behavioral disturbance, psychotic disturbance, mood disturbance, or anxiety (HCC) His memory is stable on memantine  10 mg twice a day.  Osteoporosis I have discussed with him to do strengthening exercises and he will continue to take vitamin D  daily.  Bilateral leg edema He will take Lasix  as needed.  I  have also given him prescription of elastic stockings.  Dyslipidemia His LDL is target controlled and he take atorvastatin  10 mg without any side effect.  Proximal limb muscle weakness He has difficulty getting up from chair in our office.  I have suggested to get physical therapy for proximal muscle weakness and strength.  Recurrent major depressive disorder, in full remission (HCC) His depression is in full remission.  He take sertraline  50 mg and aripiprazole  2 mg daily without any side effects.  Vitamin D  deficiency He will continue to take vitamin D  replacement.    Return in about 3 months (around 11/01/2023).   Tita Form, MD

## 2023-08-02 NOTE — Assessment & Plan Note (Signed)
 He will continue to take vitamin D replacement.

## 2023-08-02 NOTE — Assessment & Plan Note (Signed)
 His memory is stable on memantine  10 mg twice a day.

## 2023-08-02 NOTE — Assessment & Plan Note (Signed)
His blood pressure is controlled.

## 2023-08-02 NOTE — Assessment & Plan Note (Signed)
 He will take Lasix  as needed.  I have also given him prescription of elastic stockings.

## 2023-08-02 NOTE — Assessment & Plan Note (Signed)
 He has difficulty getting up from chair in our office.  I have suggested to get physical therapy for proximal muscle weakness and strength.

## 2023-08-02 NOTE — Assessment & Plan Note (Signed)
 I have discussed with him to do strengthening exercises and he will continue to take vitamin D  daily.

## 2023-08-02 NOTE — Assessment & Plan Note (Signed)
 His depression is in full remission.  He take sertraline  50 mg and aripiprazole  2 mg daily without any side effects.

## 2023-08-03 ENCOUNTER — Encounter: Payer: Medicare Other | Admitting: Internal Medicine

## 2023-08-16 DIAGNOSIS — R600 Localized edema: Secondary | ICD-10-CM | POA: Diagnosis not present

## 2023-08-16 DIAGNOSIS — Z87891 Personal history of nicotine dependence: Secondary | ICD-10-CM | POA: Diagnosis not present

## 2023-08-16 DIAGNOSIS — M419 Scoliosis, unspecified: Secondary | ICD-10-CM | POA: Diagnosis not present

## 2023-08-16 DIAGNOSIS — I10 Essential (primary) hypertension: Secondary | ICD-10-CM | POA: Diagnosis not present

## 2023-08-16 DIAGNOSIS — F3342 Major depressive disorder, recurrent, in full remission: Secondary | ICD-10-CM | POA: Diagnosis not present

## 2023-08-16 DIAGNOSIS — Z9181 History of falling: Secondary | ICD-10-CM | POA: Diagnosis not present

## 2023-08-16 DIAGNOSIS — E785 Hyperlipidemia, unspecified: Secondary | ICD-10-CM | POA: Diagnosis not present

## 2023-08-16 DIAGNOSIS — M549 Dorsalgia, unspecified: Secondary | ICD-10-CM | POA: Diagnosis not present

## 2023-08-16 DIAGNOSIS — F02A3 Dementia in other diseases classified elsewhere, mild, with mood disturbance: Secondary | ICD-10-CM | POA: Diagnosis not present

## 2023-08-16 DIAGNOSIS — G301 Alzheimer's disease with late onset: Secondary | ICD-10-CM | POA: Diagnosis not present

## 2023-08-16 DIAGNOSIS — E559 Vitamin D deficiency, unspecified: Secondary | ICD-10-CM | POA: Diagnosis not present

## 2023-08-16 DIAGNOSIS — M810 Age-related osteoporosis without current pathological fracture: Secondary | ICD-10-CM | POA: Diagnosis not present

## 2023-08-16 DIAGNOSIS — G8929 Other chronic pain: Secondary | ICD-10-CM | POA: Diagnosis not present

## 2023-08-24 ENCOUNTER — Other Ambulatory Visit: Payer: Self-pay

## 2023-08-24 ENCOUNTER — Other Ambulatory Visit: Payer: Self-pay | Admitting: Internal Medicine

## 2023-08-24 ENCOUNTER — Other Ambulatory Visit (HOSPITAL_COMMUNITY): Payer: Self-pay

## 2023-08-24 MED ORDER — ARIPIPRAZOLE 2 MG PO TABS
2.0000 mg | ORAL_TABLET | Freq: Every day | ORAL | 3 refills | Status: DC
Start: 2023-08-24 — End: 2024-01-09
  Filled 2023-08-24: qty 30, 30d supply, fill #0
  Filled 2023-09-19 (×2): qty 30, 30d supply, fill #1
  Filled 2023-11-02: qty 30, 30d supply, fill #2
  Filled 2023-12-02: qty 30, 30d supply, fill #3

## 2023-08-26 DIAGNOSIS — G301 Alzheimer's disease with late onset: Secondary | ICD-10-CM | POA: Diagnosis not present

## 2023-08-26 DIAGNOSIS — G8929 Other chronic pain: Secondary | ICD-10-CM | POA: Diagnosis not present

## 2023-08-26 DIAGNOSIS — M549 Dorsalgia, unspecified: Secondary | ICD-10-CM | POA: Diagnosis not present

## 2023-08-26 DIAGNOSIS — F02A3 Dementia in other diseases classified elsewhere, mild, with mood disturbance: Secondary | ICD-10-CM | POA: Diagnosis not present

## 2023-08-26 DIAGNOSIS — M810 Age-related osteoporosis without current pathological fracture: Secondary | ICD-10-CM | POA: Diagnosis not present

## 2023-08-26 DIAGNOSIS — F3342 Major depressive disorder, recurrent, in full remission: Secondary | ICD-10-CM | POA: Diagnosis not present

## 2023-09-01 DIAGNOSIS — F3342 Major depressive disorder, recurrent, in full remission: Secondary | ICD-10-CM | POA: Diagnosis not present

## 2023-09-01 DIAGNOSIS — G301 Alzheimer's disease with late onset: Secondary | ICD-10-CM | POA: Diagnosis not present

## 2023-09-01 DIAGNOSIS — G8929 Other chronic pain: Secondary | ICD-10-CM | POA: Diagnosis not present

## 2023-09-01 DIAGNOSIS — M549 Dorsalgia, unspecified: Secondary | ICD-10-CM | POA: Diagnosis not present

## 2023-09-01 DIAGNOSIS — M810 Age-related osteoporosis without current pathological fracture: Secondary | ICD-10-CM | POA: Diagnosis not present

## 2023-09-01 DIAGNOSIS — F02A3 Dementia in other diseases classified elsewhere, mild, with mood disturbance: Secondary | ICD-10-CM | POA: Diagnosis not present

## 2023-09-06 DIAGNOSIS — M549 Dorsalgia, unspecified: Secondary | ICD-10-CM | POA: Diagnosis not present

## 2023-09-06 DIAGNOSIS — G8929 Other chronic pain: Secondary | ICD-10-CM | POA: Diagnosis not present

## 2023-09-06 DIAGNOSIS — F02A3 Dementia in other diseases classified elsewhere, mild, with mood disturbance: Secondary | ICD-10-CM | POA: Diagnosis not present

## 2023-09-06 DIAGNOSIS — G301 Alzheimer's disease with late onset: Secondary | ICD-10-CM | POA: Diagnosis not present

## 2023-09-06 DIAGNOSIS — M810 Age-related osteoporosis without current pathological fracture: Secondary | ICD-10-CM | POA: Diagnosis not present

## 2023-09-06 DIAGNOSIS — F3342 Major depressive disorder, recurrent, in full remission: Secondary | ICD-10-CM | POA: Diagnosis not present

## 2023-09-15 DIAGNOSIS — M549 Dorsalgia, unspecified: Secondary | ICD-10-CM | POA: Diagnosis not present

## 2023-09-15 DIAGNOSIS — E785 Hyperlipidemia, unspecified: Secondary | ICD-10-CM | POA: Diagnosis not present

## 2023-09-15 DIAGNOSIS — Z9181 History of falling: Secondary | ICD-10-CM | POA: Diagnosis not present

## 2023-09-15 DIAGNOSIS — Z87891 Personal history of nicotine dependence: Secondary | ICD-10-CM | POA: Diagnosis not present

## 2023-09-15 DIAGNOSIS — F02A3 Dementia in other diseases classified elsewhere, mild, with mood disturbance: Secondary | ICD-10-CM | POA: Diagnosis not present

## 2023-09-15 DIAGNOSIS — F3342 Major depressive disorder, recurrent, in full remission: Secondary | ICD-10-CM | POA: Diagnosis not present

## 2023-09-15 DIAGNOSIS — G301 Alzheimer's disease with late onset: Secondary | ICD-10-CM | POA: Diagnosis not present

## 2023-09-15 DIAGNOSIS — G8929 Other chronic pain: Secondary | ICD-10-CM | POA: Diagnosis not present

## 2023-09-15 DIAGNOSIS — E559 Vitamin D deficiency, unspecified: Secondary | ICD-10-CM | POA: Diagnosis not present

## 2023-09-15 DIAGNOSIS — M419 Scoliosis, unspecified: Secondary | ICD-10-CM | POA: Diagnosis not present

## 2023-09-15 DIAGNOSIS — I10 Essential (primary) hypertension: Secondary | ICD-10-CM | POA: Diagnosis not present

## 2023-09-15 DIAGNOSIS — R600 Localized edema: Secondary | ICD-10-CM | POA: Diagnosis not present

## 2023-09-15 DIAGNOSIS — M810 Age-related osteoporosis without current pathological fracture: Secondary | ICD-10-CM | POA: Diagnosis not present

## 2023-09-19 ENCOUNTER — Other Ambulatory Visit: Payer: Self-pay | Admitting: Internal Medicine

## 2023-09-19 ENCOUNTER — Other Ambulatory Visit (HOSPITAL_COMMUNITY): Payer: Self-pay

## 2023-09-20 DIAGNOSIS — F02A3 Dementia in other diseases classified elsewhere, mild, with mood disturbance: Secondary | ICD-10-CM | POA: Diagnosis not present

## 2023-09-20 DIAGNOSIS — M549 Dorsalgia, unspecified: Secondary | ICD-10-CM | POA: Diagnosis not present

## 2023-09-20 DIAGNOSIS — G301 Alzheimer's disease with late onset: Secondary | ICD-10-CM | POA: Diagnosis not present

## 2023-09-20 DIAGNOSIS — F3342 Major depressive disorder, recurrent, in full remission: Secondary | ICD-10-CM | POA: Diagnosis not present

## 2023-09-20 DIAGNOSIS — G8929 Other chronic pain: Secondary | ICD-10-CM | POA: Diagnosis not present

## 2023-09-20 DIAGNOSIS — M810 Age-related osteoporosis without current pathological fracture: Secondary | ICD-10-CM | POA: Diagnosis not present

## 2023-09-23 ENCOUNTER — Other Ambulatory Visit (HOSPITAL_COMMUNITY): Payer: Self-pay

## 2023-09-23 DIAGNOSIS — H26491 Other secondary cataract, right eye: Secondary | ICD-10-CM | POA: Diagnosis not present

## 2023-09-27 ENCOUNTER — Encounter: Payer: Self-pay | Admitting: Internal Medicine

## 2023-09-27 ENCOUNTER — Ambulatory Visit: Admitting: Internal Medicine

## 2023-09-27 VITALS — BP 126/80 | HR 65 | Temp 98.1°F | Resp 18 | Ht 71.0 in | Wt 183.0 lb

## 2023-09-27 DIAGNOSIS — L570 Actinic keratosis: Secondary | ICD-10-CM | POA: Diagnosis not present

## 2023-09-27 DIAGNOSIS — L821 Other seborrheic keratosis: Secondary | ICD-10-CM | POA: Diagnosis not present

## 2023-09-27 NOTE — Progress Notes (Signed)
   Acute Office Visit  Subjective:     Patient ID: Andrew Poole, male    DOB: 01/06/1936, 88 y.o.   MRN: [id number]  Chief Complaint  Patient presents with   Office visit    Places on face.    HPI Patient is in today for   Irritated area on his nose and just below right eye for more than a year.  He says that area is not healing.  His daughter is also worried about trigger black spot on is left cheek in his beard she just wanted to make sure it is not melanoma.  No other complaint.  Review of Systems  Constitutional: Negative.   Skin:  Positive for itching.         To area of actinic keratosis on his nose and just below left eye.  He also has a black area on left side of his face where he has a beard.        Objective:    BP 126/80 (BP Location: Left Arm, Patient Position: Sitting, Cuff Size: Normal)   Pulse 65   Temp 98.1 F (36.7 C)   Resp 18   Ht 5' 11 (1.803 m)   Wt 183 lb (83 kg)   SpO2 98%   BMI 25.52 kg/m    Physical Exam Constitutional:      Appearance: Normal appearance.   Skin:    Comments:   Two areas of actinic keratosis on his middle of nose bridge and also just below left eye.    He also has a possible actinic keratosis on his left side face   Neurological:     Mental Status: He is alert.     No results found for any visits on 09/27/23.      Assessment & Plan:   Problem List Items Addressed This Visit   None   No orders of the defined types were placed in this encounter.   No follow-ups on file.  Tita Form, MD

## 2023-09-27 NOTE — Assessment & Plan Note (Signed)
 Area was freeze to with Biofreeze time 2 in our office

## 2023-09-27 NOTE — Assessment & Plan Note (Signed)
 Area on his nose was freeze with Biofreeze

## 2023-09-27 NOTE — Assessment & Plan Note (Signed)
 I will refer him to see Dermatology

## 2023-09-28 DIAGNOSIS — F3342 Major depressive disorder, recurrent, in full remission: Secondary | ICD-10-CM | POA: Diagnosis not present

## 2023-09-28 DIAGNOSIS — M810 Age-related osteoporosis without current pathological fracture: Secondary | ICD-10-CM | POA: Diagnosis not present

## 2023-09-28 DIAGNOSIS — G8929 Other chronic pain: Secondary | ICD-10-CM | POA: Diagnosis not present

## 2023-09-28 DIAGNOSIS — F02A3 Dementia in other diseases classified elsewhere, mild, with mood disturbance: Secondary | ICD-10-CM | POA: Diagnosis not present

## 2023-09-28 DIAGNOSIS — G301 Alzheimer's disease with late onset: Secondary | ICD-10-CM | POA: Diagnosis not present

## 2023-09-28 DIAGNOSIS — M549 Dorsalgia, unspecified: Secondary | ICD-10-CM | POA: Diagnosis not present

## 2023-10-06 ENCOUNTER — Other Ambulatory Visit: Payer: Self-pay | Admitting: Internal Medicine

## 2023-10-06 ENCOUNTER — Other Ambulatory Visit (HOSPITAL_COMMUNITY): Payer: Self-pay

## 2023-10-06 DIAGNOSIS — M549 Dorsalgia, unspecified: Secondary | ICD-10-CM | POA: Diagnosis not present

## 2023-10-06 DIAGNOSIS — F3342 Major depressive disorder, recurrent, in full remission: Secondary | ICD-10-CM | POA: Diagnosis not present

## 2023-10-06 DIAGNOSIS — F02A3 Dementia in other diseases classified elsewhere, mild, with mood disturbance: Secondary | ICD-10-CM | POA: Diagnosis not present

## 2023-10-06 DIAGNOSIS — M810 Age-related osteoporosis without current pathological fracture: Secondary | ICD-10-CM | POA: Diagnosis not present

## 2023-10-06 DIAGNOSIS — G301 Alzheimer's disease with late onset: Secondary | ICD-10-CM | POA: Diagnosis not present

## 2023-10-06 DIAGNOSIS — G8929 Other chronic pain: Secondary | ICD-10-CM | POA: Diagnosis not present

## 2023-10-10 ENCOUNTER — Other Ambulatory Visit: Payer: Self-pay

## 2023-10-10 ENCOUNTER — Other Ambulatory Visit: Payer: Self-pay | Admitting: Internal Medicine

## 2023-10-10 ENCOUNTER — Other Ambulatory Visit (HOSPITAL_COMMUNITY): Payer: Self-pay

## 2023-10-10 MED ORDER — AMLODIPINE BESYLATE 2.5 MG PO TABS
2.5000 mg | ORAL_TABLET | Freq: Every day | ORAL | 0 refills | Status: DC
  Filled 2023-10-10: qty 90, 90d supply, fill #0

## 2023-10-10 MED ORDER — OXYCODONE-ACETAMINOPHEN 5-325 MG PO TABS
1.0000 | ORAL_TABLET | Freq: Three times a day (TID) | ORAL | 0 refills | Status: DC | PRN
  Filled 2023-10-10: qty 60, 20d supply, fill #0

## 2023-10-11 DIAGNOSIS — M810 Age-related osteoporosis without current pathological fracture: Secondary | ICD-10-CM | POA: Diagnosis not present

## 2023-10-11 DIAGNOSIS — G8929 Other chronic pain: Secondary | ICD-10-CM | POA: Diagnosis not present

## 2023-10-11 DIAGNOSIS — G301 Alzheimer's disease with late onset: Secondary | ICD-10-CM | POA: Diagnosis not present

## 2023-10-11 DIAGNOSIS — F3342 Major depressive disorder, recurrent, in full remission: Secondary | ICD-10-CM | POA: Diagnosis not present

## 2023-10-11 DIAGNOSIS — M549 Dorsalgia, unspecified: Secondary | ICD-10-CM | POA: Diagnosis not present

## 2023-10-11 DIAGNOSIS — F02A3 Dementia in other diseases classified elsewhere, mild, with mood disturbance: Secondary | ICD-10-CM | POA: Diagnosis not present

## 2023-10-20 ENCOUNTER — Other Ambulatory Visit (HOSPITAL_COMMUNITY): Payer: Self-pay

## 2023-10-20 MED ORDER — FLUOROMETHOLONE 0.1 % OP SUSP
1.0000 [drp] | Freq: Four times a day (QID) | OPHTHALMIC | 0 refills | Status: DC
  Filled 2023-10-20: qty 5, 25d supply, fill #0

## 2023-10-24 DIAGNOSIS — H26491 Other secondary cataract, right eye: Secondary | ICD-10-CM | POA: Diagnosis not present

## 2023-11-02 ENCOUNTER — Other Ambulatory Visit: Payer: Self-pay | Admitting: Internal Medicine

## 2023-11-02 ENCOUNTER — Other Ambulatory Visit (HOSPITAL_COMMUNITY): Payer: Self-pay

## 2023-11-02 ENCOUNTER — Other Ambulatory Visit (HOSPITAL_BASED_OUTPATIENT_CLINIC_OR_DEPARTMENT_OTHER): Payer: Self-pay

## 2023-11-03 ENCOUNTER — Other Ambulatory Visit (HOSPITAL_COMMUNITY): Payer: Self-pay

## 2023-11-03 ENCOUNTER — Other Ambulatory Visit: Payer: Self-pay | Admitting: Internal Medicine

## 2023-11-03 MED ORDER — MELOXICAM 7.5 MG PO TABS
7.5000 mg | ORAL_TABLET | Freq: Every day | ORAL | 1 refills | Status: DC
  Filled 2023-11-03: qty 90, 90d supply, fill #0

## 2023-11-08 ENCOUNTER — Ambulatory Visit: Admitting: Internal Medicine

## 2023-11-09 ENCOUNTER — Other Ambulatory Visit: Payer: Self-pay | Admitting: Internal Medicine

## 2023-11-09 ENCOUNTER — Other Ambulatory Visit (HOSPITAL_COMMUNITY): Payer: Self-pay

## 2023-11-09 ENCOUNTER — Other Ambulatory Visit (HOSPITAL_BASED_OUTPATIENT_CLINIC_OR_DEPARTMENT_OTHER): Payer: Self-pay

## 2023-11-09 MED ORDER — GABAPENTIN 300 MG PO CAPS
300.0000 mg | ORAL_CAPSULE | Freq: Two times a day (BID) | ORAL | 2 refills | Status: DC
  Filled 2023-11-09: qty 60, 30d supply, fill #0
  Filled 2023-12-14: qty 60, 30d supply, fill #1

## 2023-11-14 ENCOUNTER — Ambulatory Visit: Admitting: Dermatology

## 2023-11-14 ENCOUNTER — Encounter: Payer: Self-pay | Admitting: Dermatology

## 2023-11-14 DIAGNOSIS — L821 Other seborrheic keratosis: Secondary | ICD-10-CM

## 2023-11-14 NOTE — Patient Instructions (Signed)

## 2023-11-14 NOTE — Progress Notes (Signed)
   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 Not painful or bleeding. Patient is accompanied by his daughter. Patient is a retired Sports administrator.  The following portions of the chart were reviewed this encounter and updated as appropriate: medications, allergies, medical history  Review of Systems:  No other skin or systemic complaints except as noted in HPI or Assessment and Plan.  Objective  Well appearing patient in no apparent distress; mood and affect are within normal limits.  A focused examination was performed of the following areas: Left temple  Relevant exam findings are noted in the Assessment and Plan.    Assessment & Plan   SEBORRHEIC KERATOSIS left temple - Stuck-on, waxy, tan-brown papules and/or plaques  - Benign-appearing - Discussed benign etiology and prognosis. - Observe - Call for any changes  Return if symptoms worsen or fail to improve.  I, Darice Smock, CMA, am acting as scribe for RUFUS CHRISTELLA HOLY, MD.   Documentation: I have reviewed the above documentation for accuracy and completeness, and I agree with the above.  RUFUS CHRISTELLA HOLY, MD

## 2023-11-22 ENCOUNTER — Other Ambulatory Visit (HOSPITAL_BASED_OUTPATIENT_CLINIC_OR_DEPARTMENT_OTHER): Payer: Self-pay

## 2023-11-22 ENCOUNTER — Ambulatory Visit: Admitting: Internal Medicine

## 2023-11-22 ENCOUNTER — Other Ambulatory Visit (HOSPITAL_COMMUNITY): Payer: Self-pay

## 2023-11-22 ENCOUNTER — Encounter: Payer: Self-pay | Admitting: Internal Medicine

## 2023-11-22 VITALS — BP 120/70 | HR 70 | Temp 98.3°F | Resp 18 | Ht 71.0 in | Wt 180.4 lb

## 2023-11-22 DIAGNOSIS — E785 Hyperlipidemia, unspecified: Secondary | ICD-10-CM

## 2023-11-22 DIAGNOSIS — I10 Essential (primary) hypertension: Secondary | ICD-10-CM | POA: Diagnosis not present

## 2023-11-22 DIAGNOSIS — M545 Low back pain, unspecified: Secondary | ICD-10-CM

## 2023-11-22 DIAGNOSIS — K5903 Drug induced constipation: Secondary | ICD-10-CM | POA: Diagnosis not present

## 2023-11-22 DIAGNOSIS — F02A0 Dementia in other diseases classified elsewhere, mild, without behavioral disturbance, psychotic disturbance, mood disturbance, and anxiety: Secondary | ICD-10-CM

## 2023-11-22 DIAGNOSIS — G301 Alzheimer's disease with late onset: Secondary | ICD-10-CM | POA: Diagnosis not present

## 2023-11-22 DIAGNOSIS — F3342 Major depressive disorder, recurrent, in full remission: Secondary | ICD-10-CM | POA: Diagnosis not present

## 2023-11-22 DIAGNOSIS — M810 Age-related osteoporosis without current pathological fracture: Secondary | ICD-10-CM

## 2023-11-22 DIAGNOSIS — G8929 Other chronic pain: Secondary | ICD-10-CM | POA: Diagnosis not present

## 2023-11-22 MED ORDER — NALOXEGOL OXALATE 25 MG PO TABS
25.0000 mg | ORAL_TABLET | Freq: Every day | ORAL | 6 refills | Status: DC
  Filled 2023-11-22: qty 30, 30d supply, fill #0
  Filled 2023-12-14 – 2023-12-15 (×2): qty 30, 30d supply, fill #1
  Filled 2024-01-06 – 2024-01-20 (×3): qty 30, 30d supply, fill #2
  Filled 2024-02-15: qty 30, 30d supply, fill #3
  Filled 2024-03-05 – 2024-03-16 (×2): qty 30, 30d supply, fill #4
  Filled 2024-04-16: qty 30, 30d supply, fill #5
  Filled ????-??-??: fill #2

## 2023-11-22 MED ORDER — DONEPEZIL HCL 5 MG PO TABS
5.0000 mg | ORAL_TABLET | Freq: Every day | ORAL | 2 refills | Status: DC
  Filled 2023-11-22: qty 30, 30d supply, fill #0

## 2023-11-22 NOTE — Progress Notes (Signed)
 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   MRN: [id number]   Chief Complaint Chief Complaint  Patient presents with   Follow-up    3 month     History of Present Illness   88 years old male who is here for follow-up.   He was seen by dermatologist.  His rash was cauterized by on his face that has disappeared now.    His family reports that patient memory is slightly getting worse.  He take memantine  10 mg twice a day.  His 3 word recall in our office is 2 out of 3. He denies any side effect from movement in.    He is complaining of constipation and he says that he has hard stool when he pass he noticed some blood streaks coming along.  It only happened 1 time.  He does have constipation.   He has chronic back pain status post surgery in the past and also has mild scoliosis.  He take gabapentin  300 mg a day and oxycodone  5/325 mg in the morning and meloxicam  at nighttime. He tells me that his pain is much better and some days he only use 1 tablet for pain.  He has diagnosis of osteoporosis and was seen by endocrinologist few years ago and at that time testosterone  injection was started.  His testosterone  level was 520 last year.  He does take vitamin D  daily.   He has history of depression and he takes sertraline  50 mg and aripiprazole  2 mg daily.  He feels that he is more relaxed and does not feel that he is depressed.  He scored 0 on PHQ 2 today.   He has hypertension and take amlodipine  2.5 mg daily and his blood pressure is well-controlled.  He also has hyperlipidemia and take atorvastatin  10 mg daily.  His blood test done 2 months ago shows LDL of target control and his liver and kidney function were normal.     Past Medical History History reviewed. No pertinent past medical history.   Allergies No Known Allergies   Review of Systems Review of Systems  Constitutional: Negative.   HENT: Negative.    Respiratory: Negative.     Cardiovascular: Negative.   Gastrointestinal: Negative.   Musculoskeletal:  Negative for back pain.  Neurological: Negative.        Objective:    Vitals BP 120/70 (BP Location: Left Arm, Patient Position: Sitting, Cuff Size: Normal)   Pulse 70   Temp 98.3 F (36.8 C)   Resp 18   Ht 5' 11 (1.803 m)   Wt 180 lb 6 oz (81.8 kg)   SpO2 97%   BMI 25.16 kg/m    Physical Examination Physical Exam Constitutional:      Appearance: Normal appearance.  HENT:     Head: Normocephalic and atraumatic.  Cardiovascular:     Rate and Rhythm: Normal rate and regular rhythm.     Heart sounds: Normal heart sounds.  Pulmonary:     Effort: Pulmonary effort is normal.     Breath sounds: Normal breath sounds.  Abdominal:     General: Bowel sounds are normal.     Palpations: Abdomen is soft.  Neurological:     Mental Status: He is alert and oriented to person, place, and time.        Assessment & Plan:   Hypertension His blood pressure is controlled.  Mild late onset Alzheimer's  dementia without behavioral disturbance, psychotic disturbance, mood disturbance, or anxiety (HCC)   He take memantine  10 mg twice a day and I will add donepezil  5 mg daily and monitor.  Osteoporosis   He does not take any medications.  Recurrent major depressive disorder, in full remission (HCC)   Better  Back pain  He takes oxycodone , meloxicam  and gabapentin .  His pain is much better.  Dyslipidemia   He takes atorvastatin  10 mg daily.  Drug-induced constipation   I will start him on Movantik  25 mg daily.    Return in about 3 months (around 02/22/2024).   Roetta Dare, MD

## 2023-11-22 NOTE — Assessment & Plan Note (Signed)
 He take memantine  10 mg twice a day and I will add donepezil  5 mg daily and monitor.

## 2023-11-22 NOTE — Assessment & Plan Note (Signed)
 He takes atorvastatin  10 mg daily.

## 2023-11-22 NOTE — Assessment & Plan Note (Signed)
 He does not take any medications.

## 2023-11-22 NOTE — Assessment & Plan Note (Signed)
 He takes oxycodone , meloxicam  and gabapentin .  His pain is much better.

## 2023-11-22 NOTE — Assessment & Plan Note (Signed)
 I will start him on Movantik  25 mg daily.

## 2023-11-22 NOTE — Assessment & Plan Note (Signed)
 His blood pressure is controlled.

## 2023-11-22 NOTE — Assessment & Plan Note (Signed)
 Better

## 2023-11-23 ENCOUNTER — Ambulatory Visit: Payer: Self-pay

## 2023-11-23 ENCOUNTER — Other Ambulatory Visit (HOSPITAL_COMMUNITY): Payer: Self-pay

## 2023-11-23 LAB — CMP14 + ANION GAP
ALT: 80 IU/L — ABNORMAL HIGH (ref 0–44)
AST: 50 IU/L — ABNORMAL HIGH (ref 0–40)
Albumin: 4.2 g/dL (ref 3.7–4.7)
Alkaline Phosphatase: 56 IU/L (ref 44–121)
Anion Gap: 15 mmol/L (ref 10.0–18.0)
BUN/Creatinine Ratio: 30 — ABNORMAL HIGH (ref 10–24)
BUN: 24 mg/dL (ref 8–27)
Bilirubin Total: 0.5 mg/dL (ref 0.0–1.2)
CO2: 23 mmol/L (ref 20–29)
Calcium: 8.8 mg/dL (ref 8.6–10.2)
Chloride: 104 mmol/L (ref 96–106)
Creatinine, Ser: 0.81 mg/dL (ref 0.76–1.27)
Globulin, Total: 2 g/dL (ref 1.5–4.5)
Glucose: 105 mg/dL — ABNORMAL HIGH (ref 70–99)
Potassium: 4.1 mmol/L (ref 3.5–5.2)
Sodium: 142 mmol/L (ref 134–144)
Total Protein: 6.2 g/dL (ref 6.0–8.5)
eGFR: 86 mL/min/1.73 (ref >59)

## 2023-11-24 ENCOUNTER — Other Ambulatory Visit (HOSPITAL_COMMUNITY): Payer: Self-pay

## 2023-12-02 ENCOUNTER — Other Ambulatory Visit: Payer: Self-pay | Admitting: Internal Medicine

## 2023-12-02 ENCOUNTER — Other Ambulatory Visit (HOSPITAL_COMMUNITY): Payer: Self-pay

## 2023-12-02 MED ORDER — SERTRALINE HCL 50 MG PO TABS
50.0000 mg | ORAL_TABLET | Freq: Every day | ORAL | 1 refills | Status: DC
  Filled 2023-12-02: qty 90, 90d supply, fill #0
  Filled 2024-03-06: qty 90, 90d supply, fill #1

## 2023-12-05 ENCOUNTER — Other Ambulatory Visit (HOSPITAL_COMMUNITY): Payer: Self-pay

## 2023-12-14 ENCOUNTER — Other Ambulatory Visit (HOSPITAL_COMMUNITY): Payer: Self-pay

## 2023-12-22 ENCOUNTER — Other Ambulatory Visit: Payer: Self-pay | Admitting: Internal Medicine

## 2023-12-22 ENCOUNTER — Other Ambulatory Visit (HOSPITAL_COMMUNITY): Payer: Self-pay

## 2023-12-22 MED ORDER — MEMANTINE HCL 10 MG PO TABS
10.0000 mg | ORAL_TABLET | Freq: Two times a day (BID) | ORAL | 6 refills | Status: DC
  Filled 2023-12-22 – 2024-01-02 (×3): qty 60, 30d supply, fill #0
  Filled 2024-02-06 (×2): qty 60, 30d supply, fill #1
  Filled 2024-03-05: qty 60, 30d supply, fill #2
  Filled 2024-04-10: qty 60, 30d supply, fill #3

## 2023-12-23 ENCOUNTER — Other Ambulatory Visit (HOSPITAL_COMMUNITY): Payer: Self-pay

## 2023-12-30 ENCOUNTER — Other Ambulatory Visit (HOSPITAL_COMMUNITY): Payer: Self-pay

## 2023-12-30 ENCOUNTER — Other Ambulatory Visit: Payer: Self-pay | Admitting: Internal Medicine

## 2024-01-02 ENCOUNTER — Other Ambulatory Visit (HOSPITAL_COMMUNITY): Payer: Self-pay

## 2024-01-03 DIAGNOSIS — Z23 Encounter for immunization: Secondary | ICD-10-CM | POA: Diagnosis not present

## 2024-01-05 ENCOUNTER — Other Ambulatory Visit (HOSPITAL_COMMUNITY): Payer: Self-pay

## 2024-01-06 ENCOUNTER — Other Ambulatory Visit (HOSPITAL_COMMUNITY): Payer: Self-pay

## 2024-01-06 ENCOUNTER — Other Ambulatory Visit: Payer: Self-pay | Admitting: Internal Medicine

## 2024-01-09 ENCOUNTER — Other Ambulatory Visit: Payer: Self-pay

## 2024-01-09 ENCOUNTER — Other Ambulatory Visit (HOSPITAL_BASED_OUTPATIENT_CLINIC_OR_DEPARTMENT_OTHER): Payer: Self-pay

## 2024-01-09 ENCOUNTER — Other Ambulatory Visit: Payer: Self-pay | Admitting: Internal Medicine

## 2024-01-09 ENCOUNTER — Other Ambulatory Visit (HOSPITAL_COMMUNITY): Payer: Self-pay

## 2024-01-09 MED ORDER — ARIPIPRAZOLE 2 MG PO TABS: 2.0000 mg | ORAL_TABLET | Freq: Every day | ORAL | 3 refills | Status: DC

## 2024-01-10 ENCOUNTER — Other Ambulatory Visit: Payer: Self-pay

## 2024-01-10 ENCOUNTER — Other Ambulatory Visit (HOSPITAL_COMMUNITY): Payer: Self-pay

## 2024-01-10 ENCOUNTER — Other Ambulatory Visit (HOSPITAL_BASED_OUTPATIENT_CLINIC_OR_DEPARTMENT_OTHER): Payer: Self-pay

## 2024-01-10 MED ORDER — ARIPIPRAZOLE 2 MG PO TABS
2.0000 mg | ORAL_TABLET | Freq: Every day | ORAL | 3 refills | Status: DC
  Filled 2024-01-10: qty 30, 30d supply, fill #0
  Filled 2024-02-06 (×2): qty 30, 30d supply, fill #1
  Filled 2024-03-05: qty 30, 30d supply, fill #2
  Filled 2024-04-10: qty 30, 30d supply, fill #3

## 2024-01-11 ENCOUNTER — Other Ambulatory Visit: Payer: Self-pay | Admitting: Internal Medicine

## 2024-01-11 ENCOUNTER — Other Ambulatory Visit: Payer: Self-pay

## 2024-01-11 ENCOUNTER — Other Ambulatory Visit (HOSPITAL_COMMUNITY): Payer: Self-pay

## 2024-01-11 MED ORDER — AMLODIPINE BESYLATE 2.5 MG PO TABS
2.5000 mg | ORAL_TABLET | Freq: Every day | ORAL | 0 refills | Status: DC
  Filled 2024-01-11: qty 90, 90d supply, fill #0

## 2024-01-13 ENCOUNTER — Other Ambulatory Visit: Payer: Self-pay | Admitting: Internal Medicine

## 2024-01-13 ENCOUNTER — Other Ambulatory Visit (HOSPITAL_COMMUNITY): Payer: Self-pay

## 2024-01-13 MED ORDER — OXYCODONE HCL 5 MG PO TABS
5.0000 mg | ORAL_TABLET | Freq: Three times a day (TID) | ORAL | 0 refills | Status: DC | PRN
  Filled 2024-01-13: qty 60, 20d supply, fill #0

## 2024-01-16 ENCOUNTER — Other Ambulatory Visit (HOSPITAL_COMMUNITY): Payer: Self-pay

## 2024-01-20 ENCOUNTER — Other Ambulatory Visit (HOSPITAL_COMMUNITY): Payer: Self-pay

## 2024-02-06 ENCOUNTER — Other Ambulatory Visit (HOSPITAL_COMMUNITY): Payer: Self-pay

## 2024-02-15 ENCOUNTER — Other Ambulatory Visit (HOSPITAL_COMMUNITY): Payer: Self-pay

## 2024-02-17 ENCOUNTER — Other Ambulatory Visit (HOSPITAL_COMMUNITY): Payer: Self-pay

## 2024-02-28 ENCOUNTER — Ambulatory Visit: Admitting: Internal Medicine

## 2024-03-05 ENCOUNTER — Other Ambulatory Visit (HOSPITAL_COMMUNITY): Payer: Self-pay

## 2024-03-06 ENCOUNTER — Other Ambulatory Visit (HOSPITAL_COMMUNITY): Payer: Self-pay

## 2024-03-16 ENCOUNTER — Other Ambulatory Visit (HOSPITAL_COMMUNITY): Payer: Self-pay

## 2024-03-16 ENCOUNTER — Other Ambulatory Visit: Payer: Self-pay | Admitting: Internal Medicine

## 2024-04-10 ENCOUNTER — Other Ambulatory Visit (HOSPITAL_COMMUNITY): Payer: Self-pay

## 2024-04-16 ENCOUNTER — Other Ambulatory Visit: Payer: Self-pay

## 2024-04-16 ENCOUNTER — Other Ambulatory Visit (HOSPITAL_COMMUNITY): Payer: Self-pay

## 2024-04-16 ENCOUNTER — Other Ambulatory Visit: Payer: Self-pay | Admitting: Internal Medicine

## 2024-04-16 MED ORDER — AMLODIPINE BESYLATE 2.5 MG PO TABS
2.5000 mg | ORAL_TABLET | Freq: Every day | ORAL | 0 refills | Status: DC
  Filled 2024-04-16: qty 30, 30d supply, fill #0

## 2024-04-17 ENCOUNTER — Telehealth: Admitting: Internal Medicine

## 2024-04-17 DIAGNOSIS — E785 Hyperlipidemia, unspecified: Secondary | ICD-10-CM

## 2024-04-17 DIAGNOSIS — F3342 Major depressive disorder, recurrent, in full remission: Secondary | ICD-10-CM | POA: Diagnosis not present

## 2024-04-17 DIAGNOSIS — J069 Acute upper respiratory infection, unspecified: Secondary | ICD-10-CM | POA: Insufficient documentation

## 2024-04-17 DIAGNOSIS — G301 Alzheimer's disease with late onset: Secondary | ICD-10-CM

## 2024-04-17 DIAGNOSIS — F02A0 Dementia in other diseases classified elsewhere, mild, without behavioral disturbance, psychotic disturbance, mood disturbance, and anxiety: Secondary | ICD-10-CM | POA: Diagnosis not present

## 2024-04-17 DIAGNOSIS — I10 Essential (primary) hypertension: Secondary | ICD-10-CM | POA: Diagnosis not present

## 2024-04-17 NOTE — Assessment & Plan Note (Signed)
"    He takes donepezil  and memantine  and his memory is much better and stable. "

## 2024-04-17 NOTE — Progress Notes (Signed)
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   MRN: [id number]   Chief Complaint No chief complaint on file.    History of Present Illness 89 years old male who was seen as a follow-up via video conference.  His daughter was present during that meeting.  Per patient and per daughter patient is doing much better and do not have any pain.  He has no fall.  He goes to gym 3 times a week for strength.  He stopped using oxycodone  and stopped using gabapentin .  Daughter give him Tylenol  Arthritis as needed for pain.    He is complaining of upper respiratory infection for 3 days.  Daughter thinks that she has given him because she was sick for 3 weeks now.  He has little stuffy nose and mild cough.  No fever or chills nobody pain.  No shortness of breath.    He also has constipation and he takes senna plus 3 times a week.  He also takes Movantik  even though he has stopped using oxycodone .    He has hypertension and he has take blood pressure medication.  I have reviewed medication with her daughter.    He also has hyperlipidemia and he takes atorvastatin .  He denies any side effect.    He has depression that is much better per patient he takes sertraline  and aripiprazole  as adjunct therapy.  Patient denies any side effects from these medications.   Past Medical History No past medical history on file.   Allergies Allergies[1]   Review of Systems Review of Systems  Constitutional: Negative.   HENT: Negative.    Respiratory: Negative.    Cardiovascular: Negative.   Gastrointestinal: Negative.   Neurological: Negative.        Objective:    Vitals There were no vitals taken for this visit.   Physical Examination Physical Exam Constitutional:      Appearance: Normal appearance.  Neurological:     General: No focal deficit present.     Mental Status: He is alert and oriented to person, place, and time.       As this was a tele  visit so no vital signs were taken.   Assessment & Plan:   Hypertension   His blood pressure has been good.  Upper respiratory tract infection   He will take 1 Afrin to each nostril twice a day for 2  to 3 days.  He will drink plenty of water.  Mild late onset Alzheimer's dementia without behavioral disturbance, psychotic disturbance, mood disturbance, or anxiety (HCC)   He takes donepezil  and memantine  and his memory is much better and stable.  Recurrent major depressive disorder, in full remission   He is not depressed per daughter and patient.  I will continue with current treatment.  Dyslipidemia   He will continue with atorvastatin .    His labs were drawn on last visit.  His liver function was abnormal.  I have discussed with daughter and will repeat that on next visit.    No follow-ups on file.   Roetta Dare, MD      [1] No Known Allergies  "

## 2024-04-17 NOTE — Assessment & Plan Note (Signed)
"    He will take 1 Afrin to each nostril twice a day for 2  to 3 days.  He will drink plenty of water. "

## 2024-04-17 NOTE — Assessment & Plan Note (Signed)
"    He is not depressed per daughter and patient.  I will continue with current treatment. "

## 2024-04-17 NOTE — Assessment & Plan Note (Signed)
"    His blood pressure has been good. "

## 2024-04-17 NOTE — Assessment & Plan Note (Signed)
 He will continue with atorvastatin

## 2024-04-18 ENCOUNTER — Other Ambulatory Visit (HOSPITAL_COMMUNITY): Payer: Self-pay

## 2024-04-19 ENCOUNTER — Other Ambulatory Visit (HOSPITAL_COMMUNITY): Payer: Self-pay

## 2024-04-20 ENCOUNTER — Inpatient Hospital Stay (HOSPITAL_COMMUNITY)
Admission: EM | Admit: 2024-04-20 | Discharge: 2024-05-13 | DRG: 388 | Disposition: E | Source: Skilled Nursing Facility | Attending: Internal Medicine | Admitting: Internal Medicine

## 2024-04-20 ENCOUNTER — Observation Stay (HOSPITAL_COMMUNITY)

## 2024-04-20 ENCOUNTER — Emergency Department (HOSPITAL_COMMUNITY)

## 2024-04-20 ENCOUNTER — Encounter (HOSPITAL_COMMUNITY): Payer: Self-pay

## 2024-04-20 DIAGNOSIS — R579 Shock, unspecified: Secondary | ICD-10-CM

## 2024-04-20 DIAGNOSIS — Z7982 Long term (current) use of aspirin: Secondary | ICD-10-CM

## 2024-04-20 DIAGNOSIS — E870 Hyperosmolality and hypernatremia: Secondary | ICD-10-CM | POA: Diagnosis present

## 2024-04-20 DIAGNOSIS — Z515 Encounter for palliative care: Secondary | ICD-10-CM

## 2024-04-20 DIAGNOSIS — N179 Acute kidney failure, unspecified: Secondary | ICD-10-CM | POA: Diagnosis not present

## 2024-04-20 DIAGNOSIS — J9601 Acute respiratory failure with hypoxia: Secondary | ICD-10-CM | POA: Diagnosis not present

## 2024-04-20 DIAGNOSIS — Z7409 Other reduced mobility: Secondary | ICD-10-CM | POA: Diagnosis present

## 2024-04-20 DIAGNOSIS — Z9049 Acquired absence of other specified parts of digestive tract: Secondary | ICD-10-CM

## 2024-04-20 DIAGNOSIS — K5651 Intestinal adhesions [bands], with partial obstruction: Principal | ICD-10-CM | POA: Diagnosis present

## 2024-04-20 DIAGNOSIS — I10 Essential (primary) hypertension: Secondary | ICD-10-CM | POA: Diagnosis present

## 2024-04-20 DIAGNOSIS — A419 Sepsis, unspecified organism: Secondary | ICD-10-CM | POA: Diagnosis not present

## 2024-04-20 DIAGNOSIS — F0390 Unspecified dementia without behavioral disturbance: Secondary | ICD-10-CM | POA: Diagnosis present

## 2024-04-20 DIAGNOSIS — E872 Acidosis, unspecified: Secondary | ICD-10-CM | POA: Diagnosis present

## 2024-04-20 DIAGNOSIS — N400 Enlarged prostate without lower urinary tract symptoms: Secondary | ICD-10-CM | POA: Diagnosis present

## 2024-04-20 DIAGNOSIS — Z66 Do not resuscitate: Secondary | ICD-10-CM | POA: Diagnosis present

## 2024-04-20 DIAGNOSIS — R6521 Severe sepsis with septic shock: Secondary | ICD-10-CM | POA: Diagnosis not present

## 2024-04-20 DIAGNOSIS — K56609 Unspecified intestinal obstruction, unspecified as to partial versus complete obstruction: Principal | ICD-10-CM | POA: Diagnosis present

## 2024-04-20 DIAGNOSIS — Z79899 Other long term (current) drug therapy: Secondary | ICD-10-CM

## 2024-04-20 DIAGNOSIS — E875 Hyperkalemia: Secondary | ICD-10-CM | POA: Diagnosis present

## 2024-04-20 DIAGNOSIS — E860 Dehydration: Secondary | ICD-10-CM | POA: Diagnosis present

## 2024-04-20 DIAGNOSIS — Z87891 Personal history of nicotine dependence: Secondary | ICD-10-CM

## 2024-04-20 DIAGNOSIS — E785 Hyperlipidemia, unspecified: Secondary | ICD-10-CM | POA: Diagnosis present

## 2024-04-20 DIAGNOSIS — K449 Diaphragmatic hernia without obstruction or gangrene: Secondary | ICD-10-CM | POA: Diagnosis present

## 2024-04-20 DIAGNOSIS — E876 Hypokalemia: Secondary | ICD-10-CM | POA: Diagnosis present

## 2024-04-20 LAB — COMPREHENSIVE METABOLIC PANEL WITH GFR
ALT: 34 U/L (ref 0–44)
AST: 36 U/L (ref 15–41)
Albumin: 4 g/dL (ref 3.5–5.0)
Alkaline Phosphatase: 67 U/L (ref 38–126)
Anion gap: 14 (ref 5–15)
BUN: 18 mg/dL (ref 8–23)
CO2: 22 mmol/L (ref 22–32)
Calcium: 8.6 mg/dL — ABNORMAL LOW (ref 8.9–10.3)
Chloride: 102 mmol/L (ref 98–111)
Creatinine, Ser: 0.73 mg/dL (ref 0.61–1.24)
GFR, Estimated: >60 mL/min
Glucose, Bld: 165 mg/dL — ABNORMAL HIGH (ref 70–99)
Potassium: 3.6 mmol/L (ref 3.5–5.1)
Sodium: 139 mmol/L (ref 135–145)
Total Bilirubin: 0.6 mg/dL (ref 0.0–1.2)
Total Protein: 6.9 g/dL (ref 6.5–8.1)

## 2024-04-20 LAB — URINALYSIS, ROUTINE W REFLEX MICROSCOPIC
Bilirubin Urine: NEGATIVE
Glucose, UA: NEGATIVE mg/dL
Hgb urine dipstick: NEGATIVE
Ketones, ur: 20 mg/dL — AB
Leukocytes,Ua: NEGATIVE
Nitrite: NEGATIVE
Protein, ur: NEGATIVE mg/dL
Specific Gravity, Urine: 1.042 — ABNORMAL HIGH (ref 1.005–1.030)
pH: 7 (ref 5.0–8.0)

## 2024-04-20 LAB — CBC
HCT: 43 % (ref 39.0–52.0)
Hemoglobin: 14.7 g/dL (ref 13.0–17.0)
MCH: 30.4 pg (ref 26.0–34.0)
MCHC: 34.2 g/dL (ref 30.0–36.0)
MCV: 88.8 fL (ref 80.0–100.0)
Platelets: 163 K/uL (ref 150–400)
RBC: 4.84 MIL/uL (ref 4.22–5.81)
RDW: 13.9 % (ref 11.5–15.5)
WBC: 5.2 K/uL (ref 4.0–10.5)
nRBC: 0 % (ref 0.0–0.2)

## 2024-04-20 LAB — LIPASE, BLOOD: Lipase: 25 U/L (ref 11–51)

## 2024-04-20 MED ORDER — LIDOCAINE HCL URETHRAL/MUCOSAL 2 % EX GEL
1.0000 | Freq: Once | CUTANEOUS | Status: AC
  Administered 2024-04-20: 1 via TOPICAL
  Filled 2024-04-20: qty 11

## 2024-04-20 MED ORDER — ONDANSETRON HCL 4 MG/2ML IJ SOLN
4.0000 mg | Freq: Four times a day (QID) | INTRAMUSCULAR | Status: DC | PRN
  Administered 2024-04-21 – 2024-04-24 (×4): 4 mg via INTRAVENOUS
  Filled 2024-04-20 (×4): qty 2

## 2024-04-20 MED ORDER — DIATRIZOATE MEGLUMINE & SODIUM 66-10 % PO SOLN
90.0000 mL | Freq: Once | ORAL | Status: AC
  Administered 2024-04-21: 90 mL via NASOGASTRIC
  Filled 2024-04-20: qty 90

## 2024-04-20 MED ORDER — BISACODYL 5 MG PO TBEC: 5.0000 mg | DELAYED_RELEASE_TABLET | Freq: Every day | ORAL | Status: DC | PRN

## 2024-04-20 MED ORDER — ENOXAPARIN SODIUM 40 MG/0.4ML IJ SOSY
40.0000 mg | PREFILLED_SYRINGE | INTRAMUSCULAR | Status: DC
  Administered 2024-04-20 – 2024-04-23 (×4): 40 mg via SUBCUTANEOUS
  Filled 2024-04-20 (×4): qty 0.4

## 2024-04-20 MED ORDER — HYDRALAZINE HCL 20 MG/ML IJ SOLN: 10.0000 mg | Freq: Four times a day (QID) | INTRAMUSCULAR | Status: DC | PRN

## 2024-04-20 MED ORDER — HYDROMORPHONE HCL 1 MG/ML IJ SOLN
1.0000 mg | INTRAMUSCULAR | Status: DC | PRN
  Administered 2024-04-20 – 2024-04-23 (×10): 1 mg via INTRAVENOUS
  Filled 2024-04-20 (×10): qty 1

## 2024-04-20 MED ORDER — HYDROMORPHONE HCL 1 MG/ML IJ SOLN: 1.0000 mg | INTRAMUSCULAR | Status: DC | PRN

## 2024-04-20 MED ORDER — MORPHINE SULFATE (PF) 4 MG/ML IV SOLN
4.0000 mg | Freq: Once | INTRAVENOUS | Status: AC
  Administered 2024-04-20: 4 mg via INTRAVENOUS
  Filled 2024-04-20: qty 1

## 2024-04-20 MED ORDER — ACETAMINOPHEN 325 MG PO TABS: 650.0000 mg | ORAL_TABLET | Freq: Four times a day (QID) | ORAL | Status: DC | PRN

## 2024-04-20 MED ORDER — SODIUM CHLORIDE 0.9 % IV SOLN: INTRAVENOUS | Status: AC

## 2024-04-20 MED ORDER — ALBUTEROL SULFATE (2.5 MG/3ML) 0.083% IN NEBU
2.5000 mg | INHALATION_SOLUTION | Freq: Four times a day (QID) | RESPIRATORY_TRACT | Status: DC | PRN
  Administered 2024-04-24: 2.5 mg via RESPIRATORY_TRACT
  Filled 2024-04-20: qty 3

## 2024-04-20 MED ORDER — ACETAMINOPHEN 650 MG RE SUPP
650.0000 mg | Freq: Four times a day (QID) | RECTAL | Status: DC | PRN
  Filled 2024-04-20: qty 1

## 2024-04-20 MED ORDER — HYDROMORPHONE HCL 1 MG/ML IJ SOLN
0.5000 mg | INTRAMUSCULAR | Status: DC | PRN
  Administered 2024-04-20: 0.5 mg via INTRAVENOUS
  Filled 2024-04-20: qty 1

## 2024-04-20 MED ORDER — IOHEXOL 300 MG/ML  SOLN
100.0000 mL | Freq: Once | INTRAMUSCULAR | Status: AC | PRN
  Administered 2024-04-20: 100 mL via INTRAVENOUS

## 2024-04-20 MED ORDER — POLYETHYLENE GLYCOL 3350 17 G PO PACK: 17.0000 g | PACK | Freq: Every day | ORAL | Status: DC | PRN

## 2024-04-20 MED ORDER — PANTOPRAZOLE SODIUM 40 MG IV SOLR
40.0000 mg | INTRAVENOUS | Status: DC
  Administered 2024-04-20 – 2024-04-23 (×4): 40 mg via INTRAVENOUS
  Filled 2024-04-20 (×4): qty 10

## 2024-04-20 MED ORDER — ONDANSETRON HCL 4 MG/2ML IJ SOLN
4.0000 mg | Freq: Once | INTRAMUSCULAR | Status: AC
  Administered 2024-04-20: 4 mg via INTRAVENOUS
  Filled 2024-04-20: qty 2

## 2024-04-20 MED ORDER — ONDANSETRON 4 MG PO TBDP
4.0000 mg | ORAL_TABLET | Freq: Once | ORAL | Status: AC
  Administered 2024-04-20: 4 mg via ORAL
  Filled 2024-04-20: qty 1

## 2024-04-20 NOTE — Consult Note (Signed)
 "    Andrew Poole, Dr. 04-29-1935  [id number].    Requesting MD: Dr. Richerd Later Chief Complaint/Reason for Consult: SBO  HPI:  This is a very pleasant 89 yo white male, retired sports administrator, with a history of dementia, HTN, and HLD who presents to the [facility] with c/o abdominal pain that started this morning. His daughter at the bedside provides most of his history.  He apparently was doing well last night when she spoke to him, but he called her this morning stating his abdomen was hurting.  He has apparently vomited several times since then.  He is not sure nor is the daughter when he last had a BM or flatus.  He has had a prior appendectomy and cholecystectomy that I can tell on exam.    In the ED he has been found to have a SBO on CT scan and we have been asked to see him.  ROS: ROS: see HPI  History reviewed. No pertinent family history.  History reviewed. No pertinent past medical history.  History reviewed. No pertinent surgical history.  Social History:  reports that he quit smoking about 49 years ago. His smoking use included cigarettes. He has never used smokeless tobacco. He reports current alcohol  use of about 2.0 standard drinks of alcohol  per week. He reports that he does not use drugs.  Allergies: Allergies[1]  (Not in a hospital admission)    Physical Exam: Blood pressure 128/61, pulse (!) 50, temperature 98 F (36.7 C), temperature source Oral, resp. rate 18, SpO2 94%. General: pleasant, WD, WN white male who is laying in bed in NAD HEENT: head is normocephalic, atraumatic.  Sclera are noninjected.  PERRL.  Ears and nose without any masses or lesions.  Mouth is pink and dry Heart: regular, rate, and rhythm.    Lungs:  Respiratory effort nonlabored Abd: soft, mild diffuse tenderness, but greater in the central and right side of his abdomen, increased fullness on the right side over the left, mild distention, no masses, hernias, or organomegaly. Right paramedian  scar from appendectomy and lap chole scars noted. Psych: A&O with an appropriate affect.   Results for orders placed or performed during the hospital encounter of 04/20/24 (from the past 48 hours)  Lipase, blood     Status: None   Collection Time: 04/20/24 11:28 AM  Result Value Ref Range   Lipase 25 11 - 51 U/L    Comment: Performed at [facility], [address], [place], [place] [address]  Comprehensive metabolic panel     Status: Abnormal   Collection Time: 04/20/24 11:28 AM  Result Value Ref Range   Sodium 139 135 - 145 mmol/L   Potassium 3.6 3.5 - 5.1 mmol/L   Chloride 102 98 - 111 mmol/L   CO2 22 22 - 32 mmol/L   Glucose, Bld 165 (H) 70 - 99 mg/dL    Comment: Glucose reference range applies only to samples taken after fasting for at least 8 hours.   BUN 18 8 - 23 mg/dL   Creatinine, Ser 9.26 0.61 - 1.24 mg/dL   Calcium  8.6 (L) 8.9 - 10.3 mg/dL   Total Protein 6.9 6.5 - 8.1 g/dL   Albumin 4.0 3.5 - 5.0 g/dL   AST 36 15 - 41 U/L   ALT 34 0 - 44 U/L   Alkaline Phosphatase 67 38 - 126 U/L   Total Bilirubin 0.6 0.0 - 1.2 mg/dL   GFR, Estimated >39 >39 mL/min  Comment: (NOTE) Calculated using the CKD-EPI Creatinine Equation (2021)    Anion gap 14 5 - 15    Comment: Performed at [facility], [address] [place], [place] [address]  CBC     Status: None   Collection Time: 04/20/24 11:28 AM  Result Value Ref Range   WBC 5.2 4.0 - 10.5 K/uL   RBC 4.84 4.22 - 5.81 MIL/uL   Hemoglobin 14.7 13.0 - 17.0 g/dL   HCT 56.9 60.9 - 47.9 %   MCV 88.8 80.0 - 100.0 fL   MCH 30.4 26.0 - 34.0 pg   MCHC 34.2 30.0 - 36.0 g/dL   RDW 86.0 88.4 - 84.4 %   Platelets 163 150 - 400 K/uL   nRBC 0.0 0.0 - 0.2 %    Comment: Performed at [facility], [address] [place], [place] [address]   CT ABDOMEN PELVIS W CONTRAST Result Date: 04/20/2024 CLINICAL DATA:  Suspected pancreatitis. EXAM: CT ABDOMEN AND PELVIS WITH CONTRAST  TECHNIQUE: Multidetector CT imaging of the abdomen and pelvis was performed using the standard protocol following bolus administration of intravenous contrast. RADIATION DOSE REDUCTION: This exam was performed according to the departmental dose-optimization program which includes automated exposure control, adjustment of the mA and/or kV according to patient size and/or use of iterative reconstruction technique. CONTRAST:  OMNIPAQUE  IOHEXOL  300 MG/ML  SOLN COMPARISON:  None Available. FINDINGS: Lower chest: No acute abnormality. Hepatobiliary: There is diffuse fatty infiltration of the liver parenchyma. A 2.4 cm hepatic cyst is seen within the left lobe of the liver. Status post cholecystectomy. No biliary dilatation. Pancreas: Unremarkable. No pancreatic ductal dilatation or surrounding inflammatory changes. Spleen: Numerous subcentimeter calcified granulomas are seen scattered throughout the spleen. Adrenals/Urinary Tract: Adrenal glands are unremarkable. Kidneys are normal in size, without renal calculi or hydronephrosis. Subcentimeter simple cysts are seen scattered throughout the right kidney. Bladder is unremarkable. Stomach/Bowel: There is a small hiatal hernia. The appendix is not clearly identified. Dilated small bowel loops are seen within the right lower quadrant (maximum small bowel diameter of 3.5 cm). A right lower quadrant transition zone is also noted within the expected region of the distal ileum. Noninflamed diverticula are seen throughout the sigmoid colon Vascular/Lymphatic: Aortic atherosclerosis. No enlarged abdominal or pelvic lymph nodes. Reproductive: The prostate gland is markedly enlarged. Other: No abdominal wall hernia or abnormality. No abdominopelvic ascites. Musculoskeletal: Postoperative changes are seen within the lower lumbar spine with multilevel degenerative changes present throughout the lower thoracic spine and lumbar spine. IMPRESSION: 1. Small bowel obstruction with a  right lower quadrant transition zone. 2. Hepatic steatosis. 3. Evidence of prior cholecystectomy. 4. Small hiatal hernia. 5. Sigmoid diverticulosis. 6. Markedly enlarged prostate gland. 7. Aortic atherosclerosis. Electronically Signed   By: Suzen Dials M.D.   On: 04/20/2024 13:42    Assessment/Plan SBO The patient has been seen, examined, labs, vitals, chart, and imaging personally reviewed.  He appears to have a SBO on CT scan and this is likely secondary to adhesive disease.  We will start with the SBO protocol, but we did discuss that if he fails to improve, he may require surgical intervention.  Daughter was at the bedside and is aware of the plan. Both are agreeable.  We will follow with you   FEN - NPO, NGT, IVFs VTE - ok for chemical prophylaxis from our standpoint ID - none currently needed  Dementia HTN HLD  I reviewed ED provider notes, last 24 h vitals  and pain scores, last 48 h intake and output, last 24 h labs and trends, and last 24 h imaging results.  Burnard FORBES Banter, [facility] Surgery 04/20/2024, 2:44 PM Please see Amion for pager number during day hours 7:00am-4:30pm or 7:00am -11:30am on weekends      [1] No Known Allergies  "

## 2024-04-20 NOTE — ED Triage Notes (Signed)
 Pt presents with c/o abdominal pain, nausea, and vomiting. Pt reports these symptoms have been going on since last night. Pt denies any sick contacts. Pt is also having some right lower abdominal pain with rigidity per EMS. Pt reports some bright red blood in his stool.

## 2024-04-20 NOTE — ED Provider Triage Note (Addendum)
 Emergency Medicine Provider Triage Evaluation Note  Charbel Los, Dr. 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  Lower abdominal pain, nausea, vomiting, diarrhea x24 hours.  Review of Systems  Positive:  Negative:   Physical Exam  SpO2 96%  Gen:   Awake, no distress   Resp:  Normal effort  MSK:   Moves extremities without difficulty  Other:    Medical Decision Making  Medically screening exam initiated at 11:03 AM.  Appropriate orders placed.  Zachary Darling, Dr. was informed that the remainder of the evaluation will be completed by another provider, this initial triage assessment does not replace that evaluation, and the importance of remaining in the ED until their evaluation is complete.     Hoy Nidia FALCON, [place] 04/20/24 1115

## 2024-04-20 NOTE — ED Provider Notes (Signed)
 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   Patient is an 89 year old male with a past medical history of hypertension, hyperlipidemia and dementia presenting to the emergency department with abdominal pain.  Patient states that he started to develop epigastric abdominal pain last night.  He states it woke him up from his sleep.  He states he has been nauseous and had multiple episodes of vomiting.  He denies any fever.  He states that his last bowel movement was Thursday morning but he thinks that he still may be passing gas.  He denies any dysuria or hematuria.  He states he has had a prior appendectomy and cholecystectomy.  The history is provided by the patient and a relative.  Abdominal Pain      Prior to Admission medications  Medication Sig Start Date End Date Taking? Authorizing Provider  amLODipine  (NORVASC ) 2.5 MG tablet Take 1 tablet (2.5 mg total) by mouth daily. 04/16/24   Amin, Saad, MD  ARIPiprazole  (ABILIFY ) 2 MG tablet Take 1 tablet (2 mg total) by mouth daily. 01/09/24   Amin, Saad, MD  ARIPiprazole  (ABILIFY ) 2 MG tablet Take 1 tablet (2 mg total) by mouth daily. 01/09/24   Amin, Saad, MD  aspirin EC 81 MG tablet Take by mouth.    [provider]  atorvastatin  (LIPITOR) 10 MG tablet Take 1 tablet (10 mg total) by mouth daily. 06/21/23   Amin, Saad, MD  Cholecalciferol  (VITAMIN D3) 125 MCG (5000 UT) CAPS Take 1 capsule (5,000 Units total) by mouth daily. 08/02/23   Amin, Saad, MD  donepezil  (ARICEPT ) 5 MG tablet Take 1 tablet (5 mg total) by mouth at bedtime. 11/22/23   Amin, Saad, MD  fluorometholone  (FML) 0.1 % ophthalmic suspension Place 1 drop into the right eye 4 (four) times daily for 7 days.Start after surgery. 09/23/23     furosemide  (LASIX ) 20 MG tablet Take 1 tablet (20 mg total) by mouth daily.  08/02/23   Amin, Saad, MD  memantine  (NAMENDA ) 10 MG tablet Take 1 tablet (10 mg total) by mouth 2 (two) times daily. 12/22/23   Amin, Saad, MD  Multiple Vitamin (MULTI-VITAMIN) tablet Take 1 tablet by mouth daily.    [provider]  sertraline  (ZOLOFT ) 50 MG tablet Take 1 tablet (50 mg total) by mouth daily. 12/02/23   Amin, Saad, MD    Allergies: Patient has no known allergies.    Review of Systems  Gastrointestinal:  Positive for abdominal pain.    Updated Vital Signs BP 128/61 (BP Location: Right Arm)   Pulse (!) 50   Temp 98 F (36.7 C) (Oral)   Resp 18   SpO2 94%   Physical Exam Vitals and nursing note reviewed.  Constitutional:      General: He is not in acute distress.    Appearance: He is well-developed.  HENT:     Head: Normocephalic.     Mouth/Throat:     Mouth: Mucous membranes are moist.  Eyes:     Extraocular Movements: Extraocular movements intact.  Cardiovascular:     Rate and Rhythm: Normal rate and regular rhythm.     Heart sounds: Normal heart sounds.  Pulmonary:     Effort: Pulmonary effort is normal.     Breath sounds: Normal breath sounds.  Abdominal:  General: Abdomen is flat. Bowel sounds are decreased.     Palpations: Abdomen is soft.     Tenderness: There is generalized abdominal tenderness and tenderness in the epigastric area. There is no guarding or rebound.  Skin:    General: Skin is warm and dry.  Neurological:     General: No focal deficit present.     Mental Status: He is alert and oriented to person, place, and time.  Psychiatric:        Mood and Affect: Mood normal.        Behavior: Behavior normal.     (all labs ordered are listed, but only abnormal results are displayed) Labs Reviewed  COMPREHENSIVE METABOLIC PANEL WITH GFR - Abnormal; Notable for the following components:      Result Value   Glucose, Bld 165 (*)    Calcium  8.6 (*)    All other components within normal limits  LIPASE, BLOOD  CBC  URINALYSIS,  ROUTINE W REFLEX MICROSCOPIC    EKG: None  Radiology: CT ABDOMEN PELVIS W CONTRAST Result Date: 04/20/2024 CLINICAL DATA:  Suspected pancreatitis. EXAM: CT ABDOMEN AND PELVIS WITH CONTRAST TECHNIQUE: Multidetector CT imaging of the abdomen and pelvis was performed using the standard protocol following bolus administration of intravenous contrast. RADIATION DOSE REDUCTION: This exam was performed according to the departmental dose-optimization program which includes automated exposure control, adjustment of the mA and/or kV according to patient size and/or use of iterative reconstruction technique. CONTRAST:  OMNIPAQUE  IOHEXOL  300 MG/ML  SOLN COMPARISON:  None Available. FINDINGS: Lower chest: No acute abnormality. Hepatobiliary: There is diffuse fatty infiltration of the liver parenchyma. A 2.4 cm hepatic cyst is seen within the left lobe of the liver. Status post cholecystectomy. No biliary dilatation. Pancreas: Unremarkable. No pancreatic ductal dilatation or surrounding inflammatory changes. Spleen: Numerous subcentimeter calcified granulomas are seen scattered throughout the spleen. Adrenals/Urinary Tract: Adrenal glands are unremarkable. Kidneys are normal in size, without renal calculi or hydronephrosis. Subcentimeter simple cysts are seen scattered throughout the right kidney. Bladder is unremarkable. Stomach/Bowel: There is a small hiatal hernia. The appendix is not clearly identified. Dilated small bowel loops are seen within the right lower quadrant (maximum small bowel diameter of 3.5 cm). A right lower quadrant transition zone is also noted within the expected region of the distal ileum. Noninflamed diverticula are seen throughout the sigmoid colon Vascular/Lymphatic: Aortic atherosclerosis. No enlarged abdominal or pelvic lymph nodes. Reproductive: The prostate gland is markedly enlarged. Other: No abdominal wall hernia or abnormality. No abdominopelvic ascites. Musculoskeletal:  Postoperative changes are seen within the lower lumbar spine with multilevel degenerative changes present throughout the lower thoracic spine and lumbar spine. IMPRESSION: 1. Small bowel obstruction with a right lower quadrant transition zone. 2. Hepatic steatosis. 3. Evidence of prior cholecystectomy. 4. Small hiatal hernia. 5. Sigmoid diverticulosis. 6. Markedly enlarged prostate gland. 7. Aortic atherosclerosis. Electronically Signed   By: Suzen Dials M.D.   On: 04/20/2024 13:42     Procedures   Medications Ordered in the ED  ondansetron  (ZOFRAN -ODT) disintegrating tablet 4 mg (4 mg Oral Given 04/20/24 1118)  iohexol  (OMNIPAQUE ) 300 MG/ML solution 100 mL (100 mLs Intravenous Contrast Given 04/20/24 1208)  ondansetron  (ZOFRAN ) injection 4 mg (4 mg Intravenous Given 04/20/24 1342)  morphine  (PF) 4 MG/ML injection 4 mg (4 mg Intravenous Given 04/20/24 1341)    Clinical Course as of 04/20/24 1419  Fri Apr 20, 2024  1419 I spoke with Burnard PA with general surgery who will evaluate  the patient.  [VK]    Clinical Course User Index [VK] Kingsley, Aasiya Creasey K, DO                                 Medical Decision Making This patient presents to the ED with chief complaint(s) of abdominal pain with pertinent past medical history of dementia, HTN, HLD, prior appendectomy and cholecystectomy which further complicates the presenting complaint. The complaint involves an extensive differential diagnosis and also carries with it a high risk of complications and morbidity.    The differential diagnosis includes gastritis, GERD, pancreatitis, hepatitis, gastroenteritis, obstruction  Additional history obtained: Additional history obtained from family Records reviewed Primary Care Documents  ED Course and Reassessment: On patient's arrival he was hemodynamically stable in no acute distress.  Had labs and CT imaging initiated in triage.  Patient's labs are within normal range.  CT does show small bowel  obstruction with transition point in the right lower quadrant.  Patient still has had active vomiting in the ED and will have NG tube placed.  Will consult surgery and plan for hospitalist admission.  Independent labs interpretation:  The following labs were independently interpreted: Within normal range  Independent visualization of imaging: - I independently visualized the following imaging with scope of interpretation limited to determining acute life threatening conditions related to emergency care: CTAP, which revealed SBO  Consultation: - Consulted or discussed management/test interpretation w/ external professional: general surgery, hospitalist  Consideration for admission or further workup: patient requires admission for SBO Social Determinants of health: N/A    Amount and/or Complexity of Data Reviewed Labs: ordered.  Risk Prescription drug management.       Final diagnoses:  SBO (small bowel obstruction) [facility])    ED Discharge Orders     None          Ellouise Richerd POUR, DO 04/20/24 1404  "

## 2024-04-20 NOTE — ED Notes (Signed)
Surgical at bedside.

## 2024-04-20 NOTE — H&P (Signed)
 " [facility] History and Physical  Andrew Poole, Dr. 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 Patient presented to the ED with complaint of abdominal pain, multiple episodes of vomiting since last night.  Unable to recall when the last bowel movement was, suspects in the last few days. No dysuria, hematuria. Reports past h/o appendectomy, cholecystectomy but has not had any history of bowel obstruction since then.  In the ED, patient was afebrile, heart rate 50, blood pressure 128/61, breathing on room air CBC, CMP unremarkable CT abdomen pelvis showed SBO with a right lower quadrant transition zone.  EDP discussed with general surgery.  Recommend NG tube insertion. Started on conservative management of SBO with IV fluid, IV analgesics, antiemetics Hospitalist service was consulted for inpatient management . At the time of my evaluation, patient was lying on bed.  Had a mild distress from abdominal pain. One of her daughters was at bedside. Patient lives in an independent living facility, does not use any assistive devices.  Review of Systems:  All systems were reviewed and were negative unless otherwise mentioned in the HPI   Past medical history: History reviewed. No pertinent past medical history.  Past surgical history: History reviewed. No pertinent surgical history.  Social History:  reports that he quit smoking about 49 years ago. His smoking use included cigarettes. He has never used smokeless tobacco. He reports current alcohol  use of about 2.0 standard drinks of alcohol  per week. He reports that he does not use drugs.  Allergies:  Allergies[1] Patient has no known allergies.   Family history:  History reviewed. No pertinent family history.    Physical Exam: Vitals:   04/20/24 1046 04/20/24 1108  BP:  128/61  Pulse:  (!) 50  Resp:  18  Temp:  98 F (36.7 C)  TempSrc:  Oral  SpO2: 96% 94%   Wt Readings from Last 3 Encounters:  11/22/23 81.8 kg  09/27/23 83 kg  08/02/23 84.1 kg   There is no height or weight on file to calculate BMI.  General exam: Pleasant, elderly Caucasian male. Skin: No rashes, lesions or ulcers. HEENT: Atraumatic, normocephalic, no obvious bleeding Lungs: Clear to auscultation bilaterally,  CVS: S1, S2, no murmur,   GI/Abd: Soft, mild diffuse distention, tenderness, bowel sound present,   CNS: Alert, awake, oriented to place and person Psychiatry: Sad affect Extremities: No pedal edema, no calf tenderness,    ----------------------------------------------------------------------------------------------------------------------------------------- ----------------------------------------------------------------------------------------------------------------------------------------- -----------------------------------------------------------------------------------------------------------------------------------------  Assessment/Plan: Small bowel obstruction Presented with abdominal pain, multiple episodes of vomiting since last night.  Recalls last bowel movement yesterday morning. Reports past h/o appendectomy, cholecystectomy but has not had any history of bowel obstruction since then. CT abdomen pelvis showed SBO with a right lower quadrant transition zone. EDP discussed with general surgery.  Recommended NG tube insertion Started conservative management of SBO with IV fluid, IV analgesics, antiemetics Keep oral meds on hold and plan to resume after oral intake ensures. Start NS at 75 mL/h.   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  Monitor electrolytes Recent Labs  Lab 04/20/24 1128  K 3.6   Hypertension PTA meds- amlodipine , Lasix  Oral meds currently on hold IV  hydralazine  as needed  HLD Aspirin, statin-hold for now.  Dementia Abilify , Namenda , Zoloft -hold for now  Small hiatal hernia IV PPI  BPH  CT  abdomen noted markedly enlarged prostate gland Will start Flomax once oral intake improves  Home med list has not been obtained/updated by [facility] at this time.  Please double check admission reconciliation.     Mobility: Independent at baseline impaired mobility PT Orders:   PT Follow up Rec:    Goals of care:   Code Status: Limited: Do not attempt resuscitation (DNR) -DNR-LIMITED -Do Not Intubate/DNI .  Confirmed with patient and his daughter at bedside   DVT prophylaxis:  enoxaparin  (LOVENOX ) injection 40 mg Start: 04/20/24 2200   Antimicrobials: None Fluid: NS at 75 ml/hr Consultants: General Surgery Family Communication: Daughter at bedside  Status: Observation Level of care:  Telemetry   Patient is from: Independent living facility Anticipated d/c to: Back to ALF hopefully in few days  Diet:  Diet Order             Diet NPO time specified  Diet effective now                    ------------------------------------------------------------------------------------- Severity of Illness: The appropriate patient status for this patient is OBSERVATION. Observation status is judged to be reasonable and necessary in order to provide the required intensity of service to ensure the patient's safety. The patient's presenting symptoms, physical exam findings, and initial radiographic and laboratory data in the context of their medical condition is felt to place them at decreased risk for further clinical deterioration. Furthermore, it is anticipated that the patient will be medically stable for discharge from the hospital within 2 midnights of admission.  -------------------------------------------------------------------------------------   Home Meds: Prior to Admission medications  Medication Sig Start Date End Date  Taking? Authorizing Provider  amLODipine  (NORVASC ) 2.5 MG tablet Take 1 tablet (2.5 mg total) by mouth daily. 04/16/24   Poole, Saad, MD  ARIPiprazole  (ABILIFY ) 2 MG tablet Take 1 tablet (2 mg total) by mouth daily. 01/09/24   Poole, Saad, MD  ARIPiprazole  (ABILIFY ) 2 MG tablet Take 1 tablet (2 mg total) by mouth daily. 01/09/24   Poole, Saad, MD  aspirin EC 81 MG tablet Take by mouth.    [provider]  atorvastatin  (LIPITOR) 10 MG tablet Take 1 tablet (10 mg total) by mouth daily. 06/21/23   Poole, Saad, MD  Cholecalciferol  (VITAMIN D3) 125 MCG (5000 UT) CAPS Take 1 capsule (5,000 Units total) by mouth daily. 08/02/23   Poole, Saad, MD  donepezil  (ARICEPT ) 5 MG tablet Take 1 tablet (5 mg total) by mouth at bedtime. 11/22/23   Poole, Saad, MD  fluorometholone  (FML) 0.1 % ophthalmic suspension Place 1 drop into the right eye 4 (four) times daily for 7 days.Start after surgery. 09/23/23     furosemide  (LASIX ) 20 MG tablet Take 1 tablet (20 mg total) by mouth daily. 08/02/23   Poole, Saad, MD  memantine  (NAMENDA ) 10 MG tablet Take 1 tablet (10 mg total) by mouth 2 (two) times daily. 12/22/23   Poole, Saad, MD  Multiple Vitamin (MULTI-VITAMIN) tablet Take 1 tablet by mouth daily.    [provider]  sertraline  (ZOLOFT ) 50 MG tablet Take 1 tablet (50 mg total) by mouth daily. 12/02/23   Andrew Dirks, MD    Labs on Admission:   CBC: Recent Labs  Lab 04/20/24 1128  WBC 5.2  HGB 14.7  HCT 43.0  MCV 88.8  PLT 163    Basic Metabolic Panel: Recent Labs  Lab 04/20/24 1128  NA 139  K 3.6  CL 102  CO2 22  GLUCOSE 165*  BUN 18  CREATININE 0.73  CALCIUM  8.6*    Liver Function Tests: Recent Labs  Lab 04/20/24 1128  AST 36  ALT 34  ALKPHOS 67  BILITOT 0.6  PROT 6.9  ALBUMIN 4.0   Recent Labs  Lab 04/20/24 1128  LIPASE 25   No results for input(s): AMMONIA in the last 168 hours.  Cardiac Enzymes: No results for input(s): CKTOTAL, CKMB, CKMBINDEX, TROPONINI in the  last 168 hours.  BNP (last 3 results) No results for input(s): BNP in the last 8760 hours.  ProBNP (last 3 results) No results for input(s): PROBNP in the last 8760 hours.  CBG: No results for input(s): GLUCAP in the last 168 hours.  Lipase     Component Value Date/Time   LIPASE 25 04/20/2024 1128     Urinalysis    Component Value Date/Time   BILIRUBINUR Negative 04/19/2022 1110   PROTEINUR Negative 04/19/2022 1110   UROBILINOGEN 0.2 04/19/2022 1110   NITRITE Negative 04/19/2022 1110   LEUKOCYTESUR Negative 04/19/2022 1110     Drugs of Abuse  No results found for: LABOPIA, COCAINSCRNUR, LABBENZ, AMPHETMU, THCU, LABBARB    Radiological Exams on Admission: CT ABDOMEN PELVIS W CONTRAST Result Date: 04/20/2024 CLINICAL DATA:  Suspected pancreatitis. EXAM: CT ABDOMEN AND PELVIS WITH CONTRAST TECHNIQUE: Multidetector CT imaging of the abdomen and pelvis was performed using the standard protocol following bolus administration of intravenous contrast. RADIATION DOSE REDUCTION: This exam was performed according to the departmental dose-optimization program which includes automated exposure control, adjustment of the mA and/or kV according to patient size and/or use of iterative reconstruction technique. CONTRAST:  OMNIPAQUE  IOHEXOL  300 MG/ML  SOLN COMPARISON:  None Available. FINDINGS: Lower chest: No acute abnormality. Hepatobiliary: There is diffuse fatty infiltration of the liver parenchyma. A 2.4 cm hepatic cyst is seen within the left lobe of the liver. Status post cholecystectomy. No biliary dilatation. Pancreas: Unremarkable. No pancreatic ductal dilatation or surrounding inflammatory changes. Spleen: Numerous subcentimeter calcified granulomas are seen scattered throughout the spleen. Adrenals/Urinary Tract: Adrenal glands are unremarkable. Kidneys are normal in size, without renal calculi or hydronephrosis. Subcentimeter simple cysts are seen scattered  throughout the right kidney. Bladder is unremarkable. Stomach/Bowel: There is a small hiatal hernia. The appendix is not clearly identified. Dilated small bowel loops are seen within the right lower quadrant (maximum small bowel diameter of 3.5 cm). A right lower quadrant transition zone is also noted within the expected region of the distal ileum. Noninflamed diverticula are seen throughout the sigmoid colon Vascular/Lymphatic: Aortic atherosclerosis. No enlarged abdominal or pelvic lymph nodes. Reproductive: The prostate gland is markedly enlarged. Other: No abdominal wall hernia or abnormality. No abdominopelvic ascites. Musculoskeletal: Postoperative changes are seen within the lower lumbar spine with multilevel degenerative changes present throughout the lower thoracic spine and lumbar spine. IMPRESSION: 1. Small bowel obstruction with a right lower quadrant transition zone. 2. Hepatic steatosis. 3. Evidence of prior cholecystectomy. 4. Small hiatal hernia. 5. Sigmoid diverticulosis. 6. Markedly enlarged prostate gland. 7. Aortic atherosclerosis. Electronically Signed   By: Suzen Dials M.D.   On: 04/20/2024 13:42     Signed, Chapman Rota, MD [facility] 04/20/2024        [1] No Known Allergies  "

## 2024-04-21 ENCOUNTER — Observation Stay (HOSPITAL_COMMUNITY)

## 2024-04-21 DIAGNOSIS — N400 Enlarged prostate without lower urinary tract symptoms: Secondary | ICD-10-CM | POA: Diagnosis present

## 2024-04-21 DIAGNOSIS — Z7409 Other reduced mobility: Secondary | ICD-10-CM | POA: Diagnosis present

## 2024-04-21 DIAGNOSIS — N179 Acute kidney failure, unspecified: Secondary | ICD-10-CM | POA: Diagnosis not present

## 2024-04-21 DIAGNOSIS — K449 Diaphragmatic hernia without obstruction or gangrene: Secondary | ICD-10-CM | POA: Diagnosis present

## 2024-04-21 DIAGNOSIS — E860 Dehydration: Secondary | ICD-10-CM | POA: Diagnosis present

## 2024-04-21 DIAGNOSIS — E8721 Acute metabolic acidosis: Secondary | ICD-10-CM | POA: Diagnosis not present

## 2024-04-21 DIAGNOSIS — R6521 Severe sepsis with septic shock: Secondary | ICD-10-CM | POA: Diagnosis not present

## 2024-04-21 DIAGNOSIS — K5651 Intestinal adhesions [bands], with partial obstruction: Secondary | ICD-10-CM | POA: Diagnosis present

## 2024-04-21 DIAGNOSIS — J9601 Acute respiratory failure with hypoxia: Secondary | ICD-10-CM | POA: Diagnosis not present

## 2024-04-21 DIAGNOSIS — A419 Sepsis, unspecified organism: Secondary | ICD-10-CM | POA: Diagnosis not present

## 2024-04-21 DIAGNOSIS — I10 Essential (primary) hypertension: Secondary | ICD-10-CM | POA: Diagnosis present

## 2024-04-21 DIAGNOSIS — K56609 Unspecified intestinal obstruction, unspecified as to partial versus complete obstruction: Secondary | ICD-10-CM | POA: Diagnosis not present

## 2024-04-21 DIAGNOSIS — R579 Shock, unspecified: Secondary | ICD-10-CM | POA: Diagnosis not present

## 2024-04-21 DIAGNOSIS — Z7982 Long term (current) use of aspirin: Secondary | ICD-10-CM | POA: Diagnosis not present

## 2024-04-21 DIAGNOSIS — Z79899 Other long term (current) drug therapy: Secondary | ICD-10-CM | POA: Diagnosis not present

## 2024-04-21 DIAGNOSIS — E870 Hyperosmolality and hypernatremia: Secondary | ICD-10-CM | POA: Diagnosis present

## 2024-04-21 DIAGNOSIS — E876 Hypokalemia: Secondary | ICD-10-CM | POA: Diagnosis present

## 2024-04-21 DIAGNOSIS — E785 Hyperlipidemia, unspecified: Secondary | ICD-10-CM | POA: Diagnosis present

## 2024-04-21 DIAGNOSIS — Z515 Encounter for palliative care: Secondary | ICD-10-CM | POA: Diagnosis not present

## 2024-04-21 DIAGNOSIS — E872 Acidosis, unspecified: Secondary | ICD-10-CM | POA: Diagnosis present

## 2024-04-21 DIAGNOSIS — F0390 Unspecified dementia without behavioral disturbance: Secondary | ICD-10-CM | POA: Diagnosis present

## 2024-04-21 DIAGNOSIS — E875 Hyperkalemia: Secondary | ICD-10-CM | POA: Diagnosis present

## 2024-04-21 DIAGNOSIS — Z9049 Acquired absence of other specified parts of digestive tract: Secondary | ICD-10-CM | POA: Diagnosis not present

## 2024-04-21 DIAGNOSIS — Z66 Do not resuscitate: Secondary | ICD-10-CM | POA: Diagnosis present

## 2024-04-21 DIAGNOSIS — Z87891 Personal history of nicotine dependence: Secondary | ICD-10-CM | POA: Diagnosis not present

## 2024-04-21 LAB — CBC
HCT: 38.5 % — ABNORMAL LOW (ref 39.0–52.0)
Hemoglobin: 12.9 g/dL — ABNORMAL LOW (ref 13.0–17.0)
MCH: 30.6 pg (ref 26.0–34.0)
MCHC: 33.5 g/dL (ref 30.0–36.0)
MCV: 91.2 fL (ref 80.0–100.0)
Platelets: 145 K/uL — ABNORMAL LOW (ref 150–400)
RBC: 4.22 MIL/uL (ref 4.22–5.81)
RDW: 14.3 % (ref 11.5–15.5)
WBC: 9.4 K/uL (ref 4.0–10.5)
nRBC: 0 % (ref 0.0–0.2)

## 2024-04-21 LAB — BASIC METABOLIC PANEL WITH GFR
Anion gap: 8 (ref 5–15)
BUN: 24 mg/dL — ABNORMAL HIGH (ref 8–23)
CO2: 29 mmol/L (ref 22–32)
Calcium: 8.6 mg/dL — ABNORMAL LOW (ref 8.9–10.3)
Chloride: 106 mmol/L (ref 98–111)
Creatinine, Ser: 1.03 mg/dL (ref 0.61–1.24)
GFR, Estimated: >60 mL/min
Glucose, Bld: 118 mg/dL — ABNORMAL HIGH (ref 70–99)
Potassium: 4.3 mmol/L (ref 3.5–5.1)
Sodium: 143 mmol/L (ref 135–145)

## 2024-04-21 MED ORDER — SODIUM CHLORIDE 0.9 % IV SOLN: INTRAVENOUS | Status: DC

## 2024-04-21 NOTE — Care Management Obs Status (Signed)
 MEDICARE OBSERVATION STATUS NOTIFICATION   Patient Details  Name: Andrew Poole, Dr. MRN: [id number] Date of Birth: October 24, 1935   Medicare Observation Status Notification Given:  Yes    Rose Hegner A Daira Hine, LCSW 04/21/2024, 10:49 AM

## 2024-04-21 NOTE — Plan of Care (Signed)

## 2024-04-21 NOTE — TOC Initial Note (Signed)
 Transition of Care [facility]) - Initial/Assessment Note    Patient Details  Name: Andrew Poole, Dr. MRN: [id number] Date of Birth: 09/19/1935  Transition of Care [facility]) CM/SW Contact:    Heather DELENA Saltness, LCSW Phone Number: 04/21/2024, 10:56 AM  Clinical Narrative:                 Pt admitted to the hospital due to small bowel obstruction. Pt is disoriented with history of dementia. CSW spoke with pt's daughter, Andrew Poole [phone], via phone call to discuss discharge planning. Pt resides at [facility] in an independent living apartment. TOC will continue to follow.    Expected Discharge Plan: Home/Self Care Barriers to Discharge: Continued Medical Work up   Patient Goals and CMS Choice Patient states their goals for this hospitalization and ongoing recovery are:: To return to [place] [facility] [email or web address] Compare Post Acute Care list provided to:: Patient Represenative (must comment) Choice offered to / list presented to : Adult Children [place] ownership interest in [facility].provided to:: Adult Children    Expected Discharge Plan and Services In-house Referral: Clinical Social Work Discharge Planning Services: NA Post Acute Care Choice: Resumption of Svcs/PTA Provider Living arrangements for the past 2 months: Independent Living Facility                 DME Arranged: N/A DME Agency: NA       HH Arranged: NA HH Agency: NA        Prior Living Arrangements/Services Living arrangements for the past 2 months: Independent Living Facility Lives with:: Self Patient language and need for interpreter reviewed:: Yes Do you feel safe going back to the place where you live?: Yes      Need for Family Participation in Patient Care: Yes (Comment) Care giver support system in place?: Yes (comment)   Criminal Activity/Legal Involvement Pertinent to Current Situation/Hospitalization: No - Comment as needed  Activities of Daily Living   ADL  Screening (condition at time of admission) Independently performs ADLs?: No Does the patient have a NEW difficulty with bathing/dressing/toileting/self-feeding that is expected to last >3 days?: No Does the patient have a NEW difficulty with getting in/out of bed, walking, or climbing stairs that is expected to last >3 days?: No Does the patient have a NEW difficulty with communication that is expected to last >3 days?: No Is the patient deaf or have difficulty hearing?: No Does the patient have difficulty seeing, even when wearing glasses/contacts?: No Does the patient have difficulty concentrating, remembering, or making decisions?: Yes  Permission Sought/Granted Permission sought to share information with : Facility Medical Sales Representative, Family Supports Permission granted to share information with : Yes, Verbal Permission Granted  Share Information with NAME: Andrew Poole  Permission granted to share info w AGENCY: [place] [facility] [employer] granted to share info w Relationship: Daughter  Permission granted to share info w Contact Information: [phone]  Emotional Assessment Appearance::  (UTA) Attitude/Demeanor/Rapport: Unable to Assess Affect (typically observed): Unable to Assess   Alcohol  / Substance Use: Not Applicable Psych Involvement: No (comment)  Admission diagnosis:  Small bowel obstruction (HCC) [K56.609] SBO (small bowel obstruction) (HCC) [K56.609] Patient Active Problem List   Diagnosis Date Noted   SBO (small bowel obstruction) (HCC) 04/20/2024   Upper respiratory tract infection 04/17/2024   Drug-induced constipation 11/22/2023   Actinic keratosis of left cheek 09/27/2023   Actinic keratosis 09/27/2023   Raised seborrheic keratosis 09/27/2023   Bilateral leg edema 08/02/2023  Vitamin D  deficiency 08/02/2023   Proximal limb muscle weakness 08/02/2023   Screening for diabetes mellitus (DM) 05/17/2023   Left hip pain 11/26/2022   Left leg  weakness 10/27/2022   Mild late onset Alzheimer's dementia without behavioral disturbance, psychotic disturbance, mood disturbance, or anxiety (HCC) 09/20/2022   Weakness 04/19/2022   Ambulatory dysfunction 04/19/2022   Weight loss 04/19/2022   Back pain 04/19/2022   Kidney cysts 04/19/2022   Hypertension 03/26/2022   Osteoporosis 03/26/2022   Dyslipidemia 03/26/2022   Recurrent major depressive disorder, in full remission 03/26/2022   PCP:  Caleen Dirks, MD Pharmacy:   [facility] - [place] - [address] [place] [address] Phone: [phone] Fax: [phone]  [facility] - [facility] [address] [place] [address] Phone: [phone] Fax: [phone]    Social Drivers of Health (SDOH) Social History: SDOH Screenings   Food Insecurity: No Food Insecurity (04/20/2024)  Housing: Low Risk (04/20/2024)  Transportation Needs: No Transportation Needs (04/20/2024)  Utilities: Not At Risk (04/20/2024)  Depression (PHQ2-9): Low Risk (08/02/2023)  Social Connections: Moderately Integrated (04/20/2024)  Tobacco Use: Medium Risk (04/20/2024)   SDOH Interventions: None     Readmission Risk Interventions     No data to display         Signed: Heather Saltness, MSW, LCSW Clinical Social Worker Inpatient Care Management 04/21/2024 10:59 AM

## 2024-04-21 NOTE — Progress Notes (Signed)
 " PROGRESS NOTE  Andrew Poole, Dr.  [name]: 17-Apr-1935  PCP: Caleen Dirks, MD FMW:[id number]  DOA: 04/20/2024  LOS: 0 days  Hospital Day: 2  Subjective: Patient was seen and examined this morning. Lying on bed.  Not in distress.  No flatus or bowel movement yet.  2 daughters at bedside. 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 Patient lives in an independent living facility, does not use any assistive devices.  1/9, presented to the ED with complaint of abdominal pain, multiple episodes of vomiting since last night.  Unable to recall when the last bowel movement was, suspects in the last few days. No dysuria, hematuria. Reports past h/o appendectomy, cholecystectomy but has not had any history of bowel obstruction since then.  In the ED, patient was afebrile, heart rate 50, blood pressure 128/61, breathing on room air CBC, CMP unremarkable CT abdomen pelvis showed SBO with a right lower quadrant transition zone.  EDP discussed with general surgery.  Recommend NG tube insertion. Started on conservative management of SBO with IV fluid, IV analgesics, antiemetics Admitted to [facility]  Assessment/Plan: Small bowel obstruction Presented with abdominal pain, multiple episodes of vomiting and constipation Reports past h/o appendectomy, cholecystectomy but has not had any history of bowel obstruction since then. On admission, CT abdomen pelvis showed SBO with a right lower quadrant transition zone. Seen by general surgery. 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   Continue to monitor  electrolytes Recent Labs  Lab 04/20/24 1128 04/21/24 0601  K 3.6 4.3   Hypertension PTA meds- amlodipine  2.5 mg daily, Lasix  as needed Oral meds currently on hold IV hydralazine  as needed  HLD PTA meds- aspirin, Lipitor -on hold for now.  Dementia PTA meds- Abilify , Namenda , Zoloft  -on hold for now  Small hiatal hernia IV PPI  BPH  CT abdomen noted markedly enlarged prostate gland Will start Flomax once oral intake improves   Mobility: Independent at baseline impaired mobility.  PT eval ordered  PT Orders: Active   PT Follow up Rec:    Goals of care:   Code Status: Limited: Do not attempt resuscitation (DNR) -DNR-LIMITED -Do Not Intubate/DNI .  Confirmed with patient and his daughter at bedside   DVT prophylaxis:  enoxaparin  (LOVENOX ) injection 40 mg Start: 04/20/24 2200   Antimicrobials: None Fluid: NS at 75 ml/hr to continue Consultants: General Surgery Family Communication: Daughters at bedside  Status: Observation Level of care:  Telemetry   Patient is from: Independent living facility Anticipated d/c to: Back to ALF hopefully in few days  Diet:  Diet Order             Diet NPO time specified Except for: Ice Chips  Diet effective now                   Scheduled Meds:  enoxaparin  (LOVENOX ) injection  40 mg Subcutaneous Q24H   pantoprazole  (PROTONIX ) IV  40 mg Intravenous Q24H    PRN meds: acetaminophen  **OR** acetaminophen , albuterol , bisacodyl , hydrALAZINE , HYDROmorphone  (DILAUDID ) injection, ondansetron  (ZOFRAN ) IV, polyethylene glycol   Infusions:   sodium chloride        Antimicrobials: Anti-infectives (  From admission, onward)    None       Objective: Vitals:   04/21/24 1526 04/21/24 1549  BP: 99/65   Pulse: 93   Resp:    Temp: 99 F (37.2 C)   SpO2:  93%    Intake/Output Summary (Last 24 hours) at 04/21/2024 1600 Last data filed at 04/21/2024 1300 Gross per 24 hour  Intake 1697.99 ml  Output 1375 ml  Net 322.99  ml   Filed Weights   04/20/24 1824  Weight: 74.8 kg   Weight change:  Body mass index is 22.38 kg/m.   Physical Exam: General exam: Pleasant, elderly Caucasian male. Skin: No rashes, lesions or ulcers. HEENT: Atraumatic, normocephalic, no obvious bleeding Lungs: Clear to auscultation bilaterally,  CVS: S1, S2, no murmur,   GI/Abd: Soft, mild diffuse distention, tenderness, bowel sound present,   CNS: Alert, awake, oriented to place and person Psychiatry: Sad affect Extremities: No pedal edema, no calf tenderness,   Data Review: I have personally reviewed the laboratory data and studies available.  F/u labs ordered Unresulted Labs (From admission, onward)     Start     Ordered   04/22/24 0500  Basic metabolic panel with GFR  Tomorrow morning,   R        04/21/24 1332   04/22/24 0500  CBC with Differential/Platelet  Tomorrow morning,   R        04/21/24 1332   04/22/24 0500  Magnesium  Tomorrow morning,   R        04/21/24 1332   04/22/24 0500  Phosphorus  Tomorrow morning,   R        04/21/24 1332            Signed, Chapman Rota, MD [facility] 04/21/2024     "

## 2024-04-21 NOTE — Plan of Care (Signed)

## 2024-04-21 NOTE — Progress Notes (Signed)
 "    Subjective/Chief Complaint: No complaints   Objective: Vital signs in last 24 hours: Temp:  [97.8 F (36.6 C)-98.6 F (37 C)] 98 F (36.7 C) (01/10 0530) Pulse Rate:  [50-79] 73 (01/10 0530) Resp:  [16-19] 18 (01/10 0530) BP: (92-142)/(47-84) 115/73 (01/10 0530) SpO2:  [94 %-96 %] 94 % (01/10 0233) Weight:  [74.8 kg] 74.8 kg (01/09 1824) Last BM Date :  (pt does not know)  Intake/Output from previous day: 01/09 0701 - 01/10 0700 In: 240 [P.O.:240] Out: 400 [Urine:400] Intake/Output this shift: No intake/output data recorded.  General appearance: alert and cooperative Resp: clear to auscultation bilaterally Cardio: regular rate and rhythm GI: soft, mild tenderness  Lab Results:  Recent Labs    04/20/24 1128 04/21/24 0601  WBC 5.2 9.4  HGB 14.7 12.9*  HCT 43.0 38.5*  PLT 163 145*   BMET Recent Labs    04/20/24 1128 04/21/24 0601  NA 139 143  K 3.6 4.3  CL 102 106  CO2 22 29  GLUCOSE 165* 118*  BUN 18 24*  CREATININE 0.73 1.03  CALCIUM  8.6* 8.6*   PT/INR No results for input(s): LABPROT, INR in the last 72 hours. ABG No results for input(s): PHART, HCO3 in the last 72 hours.  Invalid input(s): PCO2, PO2  Studies/Results: DG Abd Portable 1V-Small Bowel Protocol-Position Verification Result Date: 04/20/2024 EXAM: 1 VIEW XRAY OF THE ABDOMEN 04/20/2024 03:15:00 PM COMPARISON: None available. CLINICAL HISTORY: Encounter for imaging study to confirm nasogastric (NG) tube placement. FINDINGS: LINES, TUBES AND DEVICES: Enteric tube in place with tip and side port overlying the expected region of the gastric body. Enteric tube likely within the gastric lumen. BOWEL: Nonobstructive bowel gas pattern. SOFT TISSUES: Right upper quadrant surgical clips noted. No abnormal calcifications. BONES: No acute fracture. IMPRESSION: 1. Enteric tube tip and side port overlie the expected region of the gastric body. Electronically signed by: Greig Pique MD MD  04/20/2024 03:46 PM EST RP Workstation: [id number]   CT ABDOMEN PELVIS W CONTRAST Result Date: 04/20/2024 CLINICAL DATA:  Suspected pancreatitis. EXAM: CT ABDOMEN AND PELVIS WITH CONTRAST TECHNIQUE: Multidetector CT imaging of the abdomen and pelvis was performed using the standard protocol following bolus administration of intravenous contrast. RADIATION DOSE REDUCTION: This exam was performed according to the departmental dose-optimization program which includes automated exposure control, adjustment of the mA and/or kV according to patient size and/or use of iterative reconstruction technique. CONTRAST:  OMNIPAQUE  IOHEXOL  300 MG/ML  SOLN COMPARISON:  None Available. FINDINGS: Lower chest: No acute abnormality. Hepatobiliary: There is diffuse fatty infiltration of the liver parenchyma. A 2.4 cm hepatic cyst is seen within the left lobe of the liver. Status post cholecystectomy. No biliary dilatation. Pancreas: Unremarkable. No pancreatic ductal dilatation or surrounding inflammatory changes. Spleen: Numerous subcentimeter calcified granulomas are seen scattered throughout the spleen. Adrenals/Urinary Tract: Adrenal glands are unremarkable. Kidneys are normal in size, without renal calculi or hydronephrosis. Subcentimeter simple cysts are seen scattered throughout the right kidney. Bladder is unremarkable. Stomach/Bowel: There is a small hiatal hernia. The appendix is not clearly identified. Dilated small bowel loops are seen within the right lower quadrant (maximum small bowel diameter of 3.5 cm). A right lower quadrant transition zone is also noted within the expected region of the distal ileum. Noninflamed diverticula are seen throughout the sigmoid colon Vascular/Lymphatic: Aortic atherosclerosis. No enlarged abdominal or pelvic lymph nodes. Reproductive: The prostate gland is markedly enlarged. Other: No abdominal wall hernia or abnormality. No abdominopelvic ascites. Musculoskeletal:  Postoperative  changes are seen within the lower lumbar spine with multilevel degenerative changes present throughout the lower thoracic spine and lumbar spine. IMPRESSION: 1. Small bowel obstruction with a right lower quadrant transition zone. 2. Hepatic steatosis. 3. Evidence of prior cholecystectomy. 4. Small hiatal hernia. 5. Sigmoid diverticulosis. 6. Markedly enlarged prostate gland. 7. Aortic atherosclerosis. Electronically Signed   By: Suzen Dials M.D.   On: 04/20/2024 13:42    Anti-infectives: Anti-infectives (From admission, onward)    None       Assessment/Plan: s/p * No surgery found * sbo Continue ng and bowel rest Small bowel obstruction protocol. Repeat abd xrays today VTE - ok for chemical prophylaxis from our standpoint ID - none currently needed   Dementia HTN HLD  LOS: 0 days    Deward Null III 04/21/2024  "

## 2024-04-22 ENCOUNTER — Inpatient Hospital Stay (HOSPITAL_COMMUNITY)

## 2024-04-22 ENCOUNTER — Other Ambulatory Visit: Payer: Self-pay

## 2024-04-22 DIAGNOSIS — K56609 Unspecified intestinal obstruction, unspecified as to partial versus complete obstruction: Secondary | ICD-10-CM | POA: Diagnosis not present

## 2024-04-22 LAB — CBC WITH DIFFERENTIAL/PLATELET
Abs Immature Granulocytes: 0.04 K/uL (ref 0.00–0.07)
Basophils Absolute: 0 K/uL (ref 0.0–0.1)
Basophils Relative: 0 %
Eosinophils Absolute: 0 K/uL (ref 0.0–0.5)
Eosinophils Relative: 0 %
HCT: 43.3 % (ref 39.0–52.0)
Hemoglobin: 14.4 g/dL (ref 13.0–17.0)
Immature Granulocytes: 0 %
Lymphocytes Relative: 8 %
Lymphs Abs: 0.8 K/uL (ref 0.7–4.0)
MCH: 30.5 pg (ref 26.0–34.0)
MCHC: 33.3 g/dL (ref 30.0–36.0)
MCV: 91.7 fL (ref 80.0–100.0)
Monocytes Absolute: 0.6 K/uL (ref 0.1–1.0)
Monocytes Relative: 6 %
Neutro Abs: 7.9 K/uL — ABNORMAL HIGH (ref 1.7–7.7)
Neutrophils Relative %: 86 %
Platelets: 139 K/uL — ABNORMAL LOW (ref 150–400)
RBC: 4.72 MIL/uL (ref 4.22–5.81)
RDW: 14.2 % (ref 11.5–15.5)
WBC: 9.4 K/uL (ref 4.0–10.5)
nRBC: 0 % (ref 0.0–0.2)

## 2024-04-22 LAB — BASIC METABOLIC PANEL WITH GFR
Anion gap: 10 (ref 5–15)
BUN: 35 mg/dL — ABNORMAL HIGH (ref 8–23)
CO2: 27 mmol/L (ref 22–32)
Calcium: 9.2 mg/dL (ref 8.9–10.3)
Chloride: 109 mmol/L (ref 98–111)
Creatinine, Ser: 1.05 mg/dL (ref 0.61–1.24)
GFR, Estimated: >60 mL/min
Glucose, Bld: 132 mg/dL — ABNORMAL HIGH (ref 70–99)
Potassium: 5.2 mmol/L — ABNORMAL HIGH (ref 3.5–5.1)
Sodium: 146 mmol/L — ABNORMAL HIGH (ref 135–145)

## 2024-04-22 LAB — PHOSPHORUS: Phosphorus: 2.1 mg/dL — ABNORMAL LOW (ref 2.5–4.6)

## 2024-04-22 LAB — MAGNESIUM: Magnesium: 2.8 mg/dL — ABNORMAL HIGH (ref 1.7–2.4)

## 2024-04-22 MED ORDER — SODIUM PHOSPHATES 45 MMOLE/15ML IV SOLN
30.0000 mmol | Freq: Once | INTRAVENOUS | Status: AC
  Administered 2024-04-22: 30 mmol via INTRAVENOUS
  Filled 2024-04-22: qty 10

## 2024-04-22 NOTE — Progress Notes (Signed)
 "    Subjective/Chief Complaint: Still complains of soreness in right abdomen. No flatus yet   Objective: Vital signs in last 24 hours: Temp:  [98 F (36.7 C)-99 F (37.2 C)] 98.3 F (36.8 C) (01/11 0155) Pulse Rate:  [76-93] 83 (01/11 0155) Resp:  [18] 18 (01/11 0155) BP: (99-156)/(65-81) 156/77 (01/11 0155) SpO2:  [93 %-95 %] 94 % (01/11 0155) Last BM Date :  (pt does not know)  Intake/Output from previous day: 01/10 0701 - 01/11 0700 In: 3255.7 [P.O.:750; I.V.:2505.7] Out: 2375 [Urine:575; Emesis/NG output:1800] Intake/Output this shift: No intake/output data recorded.  General appearance: alert and cooperative Resp: clear to auscultation bilaterally Cardio: regular rate and rhythm GI: soft, mild tenderness on right with some fullness  Lab Results:  Recent Labs    04/21/24 0601 04/22/24 0549  WBC 9.4 9.4  HGB 12.9* 14.4  HCT 38.5* 43.3  PLT 145* 139*   BMET Recent Labs    04/21/24 0601 04/22/24 0549  NA 143 146*  K 4.3 5.2*  CL 106 109  CO2 29 27  GLUCOSE 118* 132*  BUN 24* 35*  CREATININE 1.03 1.05  CALCIUM  8.6* 9.2   PT/INR No results for input(s): LABPROT, INR in the last 72 hours. ABG No results for input(s): PHART, HCO3 in the last 72 hours.  Invalid input(s): PCO2, PO2  Studies/Results: DG Abd 2 Views Result Date: 04/22/2024 CLINICAL DATA:  Small bowel obstruction. EXAM: ABDOMEN - 2 VIEW COMPARISON:  04/21/2024. FINDINGS: Upright film shows no evidence for intraperitoneal free air. NG tube no longer evident. Patchy airspace disease is seen in both lung bases, left greater than right. Supine imaging shows no gaseous bowel dilatation to suggest obstruction. There is gas and stool seen scattered along the colon. Convex rightward lumbar scoliosis evident with lower lumbar fixation hardware. IMPRESSION: 1. Nonobstructive bowel gas pattern. 2. Patchy airspace disease in both lung bases, left greater than right. Electronically Signed   By:  Camellia Candle M.D.   On: 04/22/2024 07:59   DG Abd Portable 1V-Small Bowel Obstruction Protocol-initial, 8 hr delay Result Date: 04/21/2024 CLINICAL DATA:  8 hour delay for small bowel obstruction. EXAM: PORTABLE ABDOMEN - 1 VIEW COMPARISON:  04/20/2024 and CT abdomen pelvis 04/20/2024. FINDINGS: Nasogastric tube tip is just beyond the gastroesophageal junction. Scattered colonic gas. Faint contrast in the small bowel. Excreted contrast in the bladder. Streaky atelectasis in the lung bases. Severe dextroconvex scoliosis. L4-5 fusion. IMPRESSION: 1. Assessment for ongoing small bowel obstruction is somewhat challenging given faint contrast within the small bowel. Scattered gas and stool in the colon argue against a high-grade small bowel obstruction. If further evaluation is desired, CT abdomen pelvis with contrast is suggested. 2. Nasogastric tube has retracted in the interval. Advanced sing approximately 8-10 cm would better position the side port in stomach. Electronically Signed   By: Newell Eke M.D.   On: 04/21/2024 10:56   DG Abd Portable 1V-Small Bowel Protocol-Position Verification Result Date: 04/20/2024 EXAM: 1 VIEW XRAY OF THE ABDOMEN 04/20/2024 03:15:00 PM COMPARISON: None available. CLINICAL HISTORY: Encounter for imaging study to confirm nasogastric (NG) tube placement. FINDINGS: LINES, TUBES AND DEVICES: Enteric tube in place with tip and side port overlying the expected region of the gastric body. Enteric tube likely within the gastric lumen. BOWEL: Nonobstructive bowel gas pattern. SOFT TISSUES: Right upper quadrant surgical clips noted. No abnormal calcifications. BONES: No acute fracture. IMPRESSION: 1. Enteric tube tip and side port overlie the expected region of the gastric body.  Electronically signed by: Greig Pique MD MD 04/20/2024 03:46 PM EST RP Workstation: [id number]   CT ABDOMEN PELVIS W CONTRAST Result Date: 04/20/2024 CLINICAL DATA:  Suspected pancreatitis. EXAM: CT ABDOMEN  AND PELVIS WITH CONTRAST TECHNIQUE: Multidetector CT imaging of the abdomen and pelvis was performed using the standard protocol following bolus administration of intravenous contrast. RADIATION DOSE REDUCTION: This exam was performed according to the departmental dose-optimization program which includes automated exposure control, adjustment of the mA and/or kV according to patient size and/or use of iterative reconstruction technique. CONTRAST:  OMNIPAQUE  IOHEXOL  300 MG/ML  SOLN COMPARISON:  None Available. FINDINGS: Lower chest: No acute abnormality. Hepatobiliary: There is diffuse fatty infiltration of the liver parenchyma. A 2.4 cm hepatic cyst is seen within the left lobe of the liver. Status post cholecystectomy. No biliary dilatation. Pancreas: Unremarkable. No pancreatic ductal dilatation or surrounding inflammatory changes. Spleen: Numerous subcentimeter calcified granulomas are seen scattered throughout the spleen. Adrenals/Urinary Tract: Adrenal glands are unremarkable. Kidneys are normal in size, without renal calculi or hydronephrosis. Subcentimeter simple cysts are seen scattered throughout the right kidney. Bladder is unremarkable. Stomach/Bowel: There is a small hiatal hernia. The appendix is not clearly identified. Dilated small bowel loops are seen within the right lower quadrant (maximum small bowel diameter of 3.5 cm). A right lower quadrant transition zone is also noted within the expected region of the distal ileum. Noninflamed diverticula are seen throughout the sigmoid colon Vascular/Lymphatic: Aortic atherosclerosis. No enlarged abdominal or pelvic lymph nodes. Reproductive: The prostate gland is markedly enlarged. Other: No abdominal wall hernia or abnormality. No abdominopelvic ascites. Musculoskeletal: Postoperative changes are seen within the lower lumbar spine with multilevel degenerative changes present throughout the lower thoracic spine and lumbar spine. IMPRESSION: 1. Small  bowel obstruction with a right lower quadrant transition zone. 2. Hepatic steatosis. 3. Evidence of prior cholecystectomy. 4. Small hiatal hernia. 5. Sigmoid diverticulosis. 6. Markedly enlarged prostate gland. 7. Aortic atherosclerosis. Electronically Signed   By: Suzen Dials M.D.   On: 04/20/2024 13:42    Anti-infectives: Anti-infectives (From admission, onward)    None       Assessment/Plan: s/p * No surgery found * sbo Although xrays show no obstructed pattern he has not opened up yet Continue ng and bowel rest. Will give him another day and monitor closely VTE - ok for chemical prophylaxis from our standpoint ID - none currently needed  LOS: 1 day    Deward Null III 04/22/2024  "

## 2024-04-22 NOTE — Plan of Care (Signed)

## 2024-04-22 NOTE — Progress Notes (Signed)
 " PROGRESS NOTE  Andrew Poole, Dr.  [name]: February 26, 1936  PCP: Amin, Saad, MD FMW:[id number]  DOA: 04/20/2024  LOS: 1 day  Hospital Day: 3  Subjective: Patient was seen and examined this morning. Lying on bed.  Not in distress more awake.  Has NG decompression ongoing.  Daughter at bedside. Was out of bed on the recliner this morning No flatus or BM yet. 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 Patient lives in an independent living facility, does not use any assistive devices.  1/9, presented to the ED with complaint of abdominal pain, multiple episodes of vomiting since last night.  Unable to recall when the last bowel movement was, suspects in the last few days. No dysuria, hematuria. Reports past h/o appendectomy, cholecystectomy but has not had any history of bowel obstruction since then.  In the ED, patient was afebrile, heart rate 50, blood pressure 128/61, breathing on room air CBC, CMP unremarkable CT abdomen pelvis showed SBO with a right lower quadrant transition zone.  EDP discussed with general surgery.  Recommend NG tube insertion. Started on conservative management of SBO with IV fluid, IV analgesics, antiemetics Admitted to [facility]  Assessment/Plan: Small bowel obstruction Presented with abdominal pain, multiple episodes of vomiting and constipation Reports past h/o appendectomy, cholecystectomy but has not had any history of bowel obstruction since then. On admission, CT abdomen pelvis showed SBO with a right lower quadrant transition zone. Seen by general surgery. Started on conservative management with NG tube decompression, IV fluid, IV analgesics, IV antiemetics 1/10 abdominal series x-rays did  not show any obstructive bowel gas pattern but patient has not passed flatus or had bowel movement yet. 1/11, general surgery recommended to continue NG decompression and bowel rest On as needed IV Dilaudid  for pain control  Dehydration  hypernatremia Hyperkalemia Hypophosphatemia Labs this morning with elevated BUN/creatinine ratio, elevated sodium, elevated potassium and low phosphorus. All secondary to fluid loss from NG decompression.  Chloride level not elevated. Currently on NS at 75 mL/h.  I will increase it to 100 mL/h.. Ordered for sodium phosphate  IV Continue to monitor labs. 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    Hypertension PTA meds- amlodipine  2.5 mg daily, Lasix  as needed Oral meds currently on hold Blood pressure in 140s and 150s.  Continue to monitor IV hydralazine  as needed  HLD PTA meds- aspirin, Lipitor -on hold for now.  Dementia PTA meds- Abilify , Namenda , Zoloft  -on hold for now  Small hiatal hernia IV PPI  BPH  CT abdomen noted markedly enlarged prostate gland Will start Flomax once oral intake improves   Mobility: Independent at baseline impaired mobility.  PT eval ordered  PT Orders: Active   PT Follow up Rec:    Goals of care:   Code Status: Limited: Do not attempt resuscitation (DNR) -DNR-LIMITED -Do Not Intubate/DNI .  Confirmed with patient and his daughter at bedside   DVT prophylaxis:  enoxaparin  (LOVENOX ) injection 40 mg Start: 04/20/24 2200   Antimicrobials: None Fluid: NS at 75 ml/hr to continue Consultants: General Surgery Family Communication: Daughters at bedside  Status: Observation Level of care:  Telemetry   Patient is from: Independent  living facility Anticipated d/c to: Back to ALF hopefully in few days  Diet:  Diet Order             Diet NPO time specified Except for: Ice Chips  Diet effective now                   Scheduled Meds:  enoxaparin  (LOVENOX ) injection  40 mg Subcutaneous Q24H   pantoprazole  (PROTONIX ) IV  40 mg Intravenous Q24H    PRN meds: acetaminophen  **OR** acetaminophen , albuterol , bisacodyl , hydrALAZINE , HYDROmorphone  (DILAUDID ) injection, ondansetron  (ZOFRAN ) IV, polyethylene glycol   Infusions:   sodium chloride  100 mL/hr at 04/22/24 1008   sodium phosphate  30 mmol in sodium chloride  0.9 % 250 mL infusion 30 mmol (04/22/24 1127)     Antimicrobials: Anti-infectives (From admission, onward)    None       Objective: Vitals:   04/22/24 0155 04/22/24 1306  BP: (!) 156/77 128/76  Pulse: 83 77  Resp: 18 18  Temp: 98.3 F (36.8 C) 97.6 F (36.4 C)  SpO2: 94%     Intake/Output Summary (Last 24 hours) at 04/22/2024 1459 Last data filed at 04/22/2024 0800 Gross per 24 hour  Intake 1827.73 ml  Output 1400 ml  Net 427.73 ml   Filed Weights   04/20/24 1824  Weight: 74.8 kg   Weight change:  Body mass index is 22.38 kg/m.   Physical Exam: General exam: Pleasant, elderly Caucasian male..  NG tube decompression ongoing. Skin: No rashes, lesions or ulcers. HEENT: Atraumatic, normocephalic, no obvious bleeding Lungs: Clear to auscultation bilaterally,  CVS: S1, S2, no murmur,   GI/Abd: Soft, mild diffuse distention, tenderness, bowel sound present,   CNS: Alert, awake, oriented to place and person Psychiatry: Sad affect Extremities: No pedal edema, no calf tenderness,   Data Review: I have personally reviewed the laboratory data and studies available.  F/u labs ordered Unresulted Labs (From admission, onward)     Start     Ordered   04/23/24 0500  Basic metabolic panel with GFR  Tomorrow morning,   R       Question:  Specimen collection method  Answer:  Lab=Lab  collect   04/22/24 0951   04/23/24 0500  CBC with Differential/Platelet  Tomorrow morning,   R       Question:  Specimen collection method  Answer:  Lab=Lab collect   04/22/24 0951            Signed, Chapman Rota, MD [facility] 04/22/2024     "

## 2024-04-23 DIAGNOSIS — K56609 Unspecified intestinal obstruction, unspecified as to partial versus complete obstruction: Secondary | ICD-10-CM | POA: Diagnosis not present

## 2024-04-23 LAB — CBC WITH DIFFERENTIAL/PLATELET
Abs Immature Granulocytes: 0.07 K/uL (ref 0.00–0.07)
Basophils Absolute: 0 K/uL (ref 0.0–0.1)
Basophils Relative: 0 %
Eosinophils Absolute: 0 K/uL (ref 0.0–0.5)
Eosinophils Relative: 0 %
HCT: 40.7 % (ref 39.0–52.0)
Hemoglobin: 13.5 g/dL (ref 13.0–17.0)
Immature Granulocytes: 1 %
Lymphocytes Relative: 9 %
Lymphs Abs: 0.7 K/uL (ref 0.7–4.0)
MCH: 30 pg (ref 26.0–34.0)
MCHC: 33.2 g/dL (ref 30.0–36.0)
MCV: 90.4 fL (ref 80.0–100.0)
Monocytes Absolute: 0.5 K/uL (ref 0.1–1.0)
Monocytes Relative: 6 %
Neutro Abs: 6.3 K/uL (ref 1.7–7.7)
Neutrophils Relative %: 84 %
Platelets: 143 K/uL — ABNORMAL LOW (ref 150–400)
RBC: 4.5 MIL/uL (ref 4.22–5.81)
RDW: 14.1 % (ref 11.5–15.5)
WBC: 7.5 K/uL (ref 4.0–10.5)
nRBC: 0 % (ref 0.0–0.2)

## 2024-04-23 LAB — BASIC METABOLIC PANEL WITH GFR
Anion gap: 11 (ref 5–15)
BUN: 31 mg/dL — ABNORMAL HIGH (ref 8–23)
CO2: 25 mmol/L (ref 22–32)
Calcium: 8.8 mg/dL — ABNORMAL LOW (ref 8.9–10.3)
Chloride: 109 mmol/L (ref 98–111)
Creatinine, Ser: 0.75 mg/dL (ref 0.61–1.24)
GFR, Estimated: >60 mL/min
Glucose, Bld: 123 mg/dL — ABNORMAL HIGH (ref 70–99)
Potassium: 3.5 mmol/L (ref 3.5–5.1)
Sodium: 145 mmol/L (ref 135–145)

## 2024-04-23 MED ORDER — SERTRALINE HCL 50 MG PO TABS
50.0000 mg | ORAL_TABLET | Freq: Every day | ORAL | Status: DC
  Administered 2024-04-23 – 2024-04-24 (×2): 50 mg via ORAL
  Filled 2024-04-23 (×2): qty 1

## 2024-04-23 MED ORDER — ARIPIPRAZOLE 2 MG PO TABS
2.0000 mg | ORAL_TABLET | Freq: Every day | ORAL | Status: DC
  Administered 2024-04-23 – 2024-04-24 (×2): 2 mg via ORAL
  Filled 2024-04-23 (×3): qty 1

## 2024-04-23 MED ORDER — MEMANTINE HCL 5 MG PO TABS
10.0000 mg | ORAL_TABLET | Freq: Two times a day (BID) | ORAL | Status: DC
  Administered 2024-04-23 – 2024-04-24 (×3): 10 mg via ORAL
  Filled 2024-04-23 (×3): qty 1

## 2024-04-23 MED ORDER — GLYCERIN (LAXATIVE) 2 G RE SUPP
1.0000 | Freq: Once | RECTAL | Status: AC
  Administered 2024-04-23: 1 via RECTAL
  Filled 2024-04-23: qty 1

## 2024-04-23 NOTE — Progress Notes (Signed)
 Patient just pulled out his NG Tube, daughter is at the bedside and requested to leave it out for now until he is seen in the morning by the Dr. Ada cover NP notified of event and agreeable to leave NG Tube out until morning. Patient alert, denies any pain.

## 2024-04-23 NOTE — Evaluation (Signed)
 Physical Therapy Evaluation Patient Details Name: Andrew Poole, Dr. 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  Patient presented to the ED with complaint of abdominal pain, multiple episodes of vomiting, CT confirmed SBO, resides at [facility] memory care  Clinical Impression  Pt admitted with above diagnosis.  Pt currently with functional limitations due to the deficits listed below (see PT Problem List). Pt will benefit from acute skilled PT to increase their independence and safety with mobility to allow discharge.       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 [facility] resides at [facility] and is ambulatory with a rollator. Patient currently required 2 persons to ambulate 6' with his rollator, gait unsteady, legs very weak.  Family just left room. Patient may benefit from HHPT if facility can  manage patient at current  levwel of assistance, Otherwise my benefit fro SNF.  Patient will benefit from continued inpatient follow up therapy, <3 hours/day     If plan is discharge home, recommend the following: A lot of help with walking and/or transfers;A lot of help with bathing/dressing/bathroom   Can travel by private vehicle   Yes    Equipment Recommendations None recommended by PT  Recommendations for Other Services       Functional Status Assessment Patient has had a recent decline in their functional status and demonstrates the ability to make significant improvements in function in a reasonable and predictable amount of time.     Precautions / Restrictions Precautions Precautions: Fall Precaution/Restrictions Comments: monitor sats, on 2 L      Mobility  Bed Mobility Overal bed mobility: Needs Assistance             General bed mobility comments: in recliner     Transfers Overall transfer level: Needs assistance Equipment used: Rolling walker (2 wheels) Transfers: Sit to/from Stand Sit to Stand: Min assist           General transfer comment: extra time to power up    Ambulation/Gait Ambulation/Gait assistance: Mod assist, +2 safety/equipment, +2 physical assistance Gait Distance (Feet): 6 Feet Assistive device: Rolling walker (2 wheels) Gait Pattern/deviations: Step-to pattern, Trunk flexed Gait velocity: decr     General Gait Details: unsteady , left knee tends to buckle slightly  Stairs            Wheelchair Mobility     Tilt Bed    Modified Rankin (Stroke Patients Only)       Balance Overall balance assessment: Needs assistance Sitting-balance support: Bilateral upper extremity supported, No upper extremity supported, Feet supported Sitting balance-Leahy Scale: Fair     Standing balance support: Reliant on assistive device for balance, During functional activity, Bilateral upper extremity supported Standing balance-Leahy Scale: Poor                               Pertinent Vitals/Pain Pain Assessment Pain Assessment: Faces Faces Pain Scale: Hurts a little bit Pain Location: right lower quadrant Pain Descriptors / Indicators: Discomfort Pain Intervention(s): Monitored during session    Home Living Family/patient expects to be discharged to:: Assisted living (memory care)   Available Help at Discharge:  (unsure) Type of Home: Apartment Home Access: Level entry  Home Equipment: Rollator (4 wheels) Additional Comments: from [facility],    Prior Function Prior Level of Function : Independent/Modified Independent             Mobility Comments: uses rollator, goes to gym 3 days a week(per last enncounter) ADLs Comments: unsure     Extremity/Trunk Assessment   Upper Extremity Assessment Upper Extremity Assessment: Generalized weakness    Lower Extremity  Assessment Lower Extremity Assessment: Generalized weakness    Cervical / Trunk Assessment Cervical / Trunk Assessment: Kyphotic  Communication   Communication Communication: No apparent difficulties    Cognition Arousal: Alert Behavior During Therapy: WFL for tasks assessed/performed   PT - Cognitive impairments: No apparent impairments, Memory                       PT - Cognition Comments: A/O x3 Following commands: Intact       Cueing Cueing Techniques: Verbal cues     General Comments      Exercises     Assessment/Plan    PT Assessment Patient needs continued PT services  PT Problem List Decreased strength;Decreased range of motion;Decreased balance;Decreased mobility;Decreased knowledge of precautions;Decreased safety awareness       PT Treatment Interventions DME instruction;Gait training;Functional mobility training;Therapeutic activities;Therapeutic exercise;Patient/family education    PT Goals (Current goals can be found in the Care Plan section)  Acute Rehab PT Goals Patient Stated Goal: to go back to  apt PT Goal Formulation: With patient Time For Goal Achievement: 05/07/24 Potential to Achieve Goals: Good    Frequency Min 2X/week     Co-evaluation               AM-PAC PT 6 Clicks Mobility  Outcome Measure Help needed turning from your back to your side while in a flat bed without using bedrails?: A Lot Help needed moving from lying on your back to sitting on the side of a flat bed without using bedrails?: A Lot Help needed moving to and from a bed to a chair (including a wheelchair)?: A Lot Help needed standing up from a chair using your arms (e.g., wheelchair or bedside chair)?: A Lot Help needed to walk in hospital room?: A Lot Help needed climbing 3-5 steps with a railing? : Total 6 Click Score: 11    End of Session Equipment Utilized During Treatment: Gait belt Activity Tolerance: Patient tolerated treatment  well Patient left: in chair;with call bell/phone within reach (not on alrm when PT arrived) Nurse Communication: Mobility status PT Visit Diagnosis: Unsteadiness on feet (R26.81);Pain;Muscle weakness (generalized) (M62.81)    Time: 8767-8745 PT Time Calculation (min) (ACUTE ONLY): 22 min   Charges:   PT Evaluation $PT Eval Low Complexity: 1 Low   PT General Charges $$ ACUTE PT VISIT: 1 Visit         Darice Potters PT Acute Rehabilitation Services Office [phone]   Potters Darice Norris 04/23/2024, 2:14 PM

## 2024-04-23 NOTE — Progress Notes (Signed)
" °   04/23/24 1034  Spiritual Encounters  Type of Visit Initial  Care provided to: Pt and family  Referral source Nurse (RN/NT/LPN)  Reason for visit Religious ritual  OnCall Visit No   Per spiritual care consult referral from Janessa Dudley, RN on patient's behalf, I provided spiritual care support for Mr Andrew Poole and his daughter, Andrew Poole, at bedside.  [name] welcomed my visit. He stated feeling better and less discomfort. He shared that he finds meaning in his Catholic faith tradition (also [employer] worship) and asked for prayer.  I celebrated with [name] his improved condition and hopes for healing as well as gratitude for his family presence. I offered prayer and words of encouragement.  Toinette Lackie L. Delores HERO.Div "

## 2024-04-23 NOTE — Progress Notes (Signed)
 "      Subjective: With pleasant with dementia so unable to tell me much.  Daughter at bedside said NGT was removed around 2 am this morning.  Had some zofran  several hours ago.  Unclear if he is passing flatus, but no BM documented or to her knowledge.  He denies abdominal pain   Objective: Vital signs in last 24 hours: Temp:  [97.6 F (36.4 C)-98.2 F (36.8 C)] 98.2 F (36.8 C) (01/12 0532) Pulse Rate:  [75-77] 75 (01/12 0532) Resp:  [18-20] 20 (01/12 0532) BP: (128-143)/(76-84) 141/79 (01/12 0532) SpO2:  [98 %] 98 % (01/11 2032) Last BM Date :  (pt does not know)  Intake/Output from previous day: 01/11 0701 - 01/12 0700 In: 30 [NG/GT:30] Out: 500 [Urine:500] Intake/Output this shift: Total I/O In: 2471.5 [I.V.:2471.5] Out: 225 [Urine:225]  PE: Gen: Pleasant, sitting up in his chair Abd: soft, mild tenderness still in RLQ, mild fullness in RLQ, but otherwise soft  Lab Results:  Recent Labs    04/22/24 0549 04/23/24 0528  WBC 9.4 7.5  HGB 14.4 13.5  HCT 43.3 40.7  PLT 139* 143*   BMET Recent Labs    04/22/24 0549 04/23/24 0528  NA 146* 145  K 5.2* 3.5  CL 109 109  CO2 27 25  GLUCOSE 132* 123*  BUN 35* 31*  CREATININE 1.05 0.75  CALCIUM  9.2 8.8*   PT/INR No results for input(s): LABPROT, INR in the last 72 hours. CMP     Component Value Date/Time   NA 145 04/23/2024 0528   NA 142 11/22/2023 1121   K 3.5 04/23/2024 0528   CL 109 04/23/2024 0528   CO2 25 04/23/2024 0528   GLUCOSE 123 (H) 04/23/2024 0528   BUN 31 (H) 04/23/2024 0528   BUN 24 11/22/2023 1121   CREATININE 0.75 04/23/2024 0528   CALCIUM  8.8 (L) 04/23/2024 0528   PROT 6.9 04/20/2024 1128   PROT 6.2 11/22/2023 1121   ALBUMIN 4.0 04/20/2024 1128   ALBUMIN 4.2 11/22/2023 1121   AST 36 04/20/2024 1128   ALT 34 04/20/2024 1128   ALKPHOS 67 04/20/2024 1128   BILITOT 0.6 04/20/2024 1128   BILITOT 0.5 11/22/2023 1121   GFRNONAA >60 04/23/2024 0528   Lipase     Component Value  Date/Time   LIPASE 25 04/20/2024 1128       Studies/Results: DG Abd 2 Views Result Date: 04/22/2024 CLINICAL DATA:  Small bowel obstruction. EXAM: ABDOMEN - 2 VIEW COMPARISON:  04/21/2024. FINDINGS: Upright film shows no evidence for intraperitoneal free air. NG tube no longer evident. Patchy airspace disease is seen in both lung bases, left greater than right. Supine imaging shows no gaseous bowel dilatation to suggest obstruction. There is gas and stool seen scattered along the colon. Convex rightward lumbar scoliosis evident with lower lumbar fixation hardware. IMPRESSION: 1. Nonobstructive bowel gas pattern. 2. Patchy airspace disease in both lung bases, left greater than right. Electronically Signed   By: Camellia Candle M.D.   On: 04/22/2024 07:59    Anti-infectives: Anti-infectives (From admission, onward)    None        Assessment/Plan SBO -x-rays difficult to tell where contrast is, but last film with a nonobstructive bowel gas pattern -no definitive bowel function yet as best as we can tell due to his dementia -mild nausea this morning, but tolerating NGT out for now -will trial CLD and see how he does -glycerin  suppository.  Struggles with constipation so will get on a  bowel regimen once we are sure he has resolved his obstruction.   -daughter at bedside.    FEN - CLD, suppository VTE - Lovenox  ID - none  I reviewed nursing notes, hospitalist notes, last 24 h vitals and pain scores, last 48 h intake and output, last 24 h labs and trends, and last 24 h imaging results.   LOS: 2 days    Burnard FORBES Banter , [facility] Surgery 04/23/2024, 11:12 AM Please see Amion for pager number during day hours 7:00am-4:30pm or 7:00am -11:30am on weekends  "

## 2024-04-23 NOTE — Plan of Care (Signed)
  Problem: Activity: Goal: Risk for activity intolerance will decrease Outcome: Progressing   Problem: Nutrition: Goal: Adequate nutrition will be maintained Outcome: Progressing   Problem: Coping: Goal: Level of anxiety will decrease Outcome: Progressing   Problem: Pain Managment: Goal: General experience of comfort will improve and/or be controlled Outcome: Progressing   Problem: Safety: Goal: Ability to remain free from injury will improve Outcome: Progressing

## 2024-04-23 NOTE — Progress Notes (Signed)
 " PROGRESS NOTE  Andrew Poole, Dr.  [name]: 1936-01-21  PCP: Amin, Saad, MD FMW:[id number]  DOA: 04/20/2024  LOS: 2 days  Hospital Day: 4  Subjective: Patient was seen and examined this morning. Sitting up in recliner.  Not in distress.  Alert, awake Monito x 3. Events of last night noted. Patient accidentally pulled out NG tube.  Family wanted to leave it out until morning. Patient is unable to tell if he is passing gas.  Has not had a bowel movement. Seen by general surgery this morning. Afebrile, hemodynamically stable with blood pressure 140s, breathing on room air Lab this morning with normal sodium and potassium. 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 Patient lives in an independent living facility, does not use any assistive devices.  1/9, presented to the ED with complaint of abdominal pain, multiple episodes of vomiting since last night.  Unable to recall when the last bowel movement was, suspects in the last few days. No dysuria, hematuria. Reports past h/o appendectomy, cholecystectomy but has not had any history of bowel obstruction since then.  In the ED, patient was afebrile, heart rate 50, blood pressure 128/61, breathing on room air CBC, CMP unremarkable CT abdomen pelvis showed SBO with a right lower quadrant transition zone.  EDP discussed with general surgery.  Recommend NG tube insertion. Started on conservative management of SBO with IV fluid, IV analgesics, antiemetics Admitted to [facility]  Assessment/Plan: Small bowel obstruction Presented with abdominal pain, multiple episodes of vomiting and constipation Reports past h/o appendectomy, cholecystectomy but has not had any history of bowel obstruction since then. On admission, CT abdomen pelvis showed SBO with a right lower quadrant transition zone. Seen by general surgery. Started on conservative management with NG  tube decompression, IV fluid, IV analgesics, IV antiemetics 1/10 abdominal series x-rays did not show any obstructive bowel gas pattern but patient has not passed flatus or had bowel movement yet. 1/11, general surgery recommended to continue NG decompression and bowel rest 1/12, patient accidentally pulled out her NG-tube last night. Per general surgery plan this morning, trial of CLD, glycerin  suppository. On as needed IV Dilaudid  for pain control  Dehydration  hypernatremia Hyperkalemia Hypophosphatemia 1/11, labs showed elevated sodium, elevated potassium, low phosphorus.   NS rate increased to 100.  Phosphorus replacement was given. 1/12, repeat labs showed normalized sodium and potassium. BUN/creatinine ratio still remains elevated. Continue IV hydration. Continue to monitor labs. 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   Unremarkable so far  Hypertension PTA meds- amlodipine  2.5 mg daily, Lasix  as needed Oral meds currently on hold Blood pressure in 140s and 150s.  Continue to monitor IV hydralazine  as needed  HLD PTA meds- aspirin, Lipitor -on hold for now.  Dementia PTA meds- Abilify , Namenda , Zoloft  -on hold for now Family wants to resume it today.  Small hiatal hernia IV PPI  BPH  CT abdomen noted  markedly enlarged prostate gland Will start Flomax once oral intake improves   Mobility: Independent at baseline impaired mobility.  PT eval ordered  PT Orders: Active   PT Follow up Rec:    Goals of care:   Code Status: Limited: Do not attempt resuscitation (DNR) -DNR-LIMITED -Do Not Intubate/DNI .  Confirmed with patient and his  daughter at bedside   DVT prophylaxis:  enoxaparin  (LOVENOX ) injection 40 mg Start: 04/20/24 2200   Antimicrobials: None Fluid: NS at 75 ml/hr to continue Consultants: General Surgery Family Communication: Daughters at bedside  Status: Observation Level of care:  Telemetry   Patient is from: Independent living facility Anticipated d/c to: Back to ALF hopefully in few days  Diet:  Diet Order             Diet clear liquid Room service appropriate? Yes; Fluid consistency: Thin  Diet effective now                   Scheduled Meds:  ARIPiprazole   2 mg Oral Daily   enoxaparin  (LOVENOX ) injection  40 mg Subcutaneous Q24H   Glycerin  (Adult)  1 suppository Rectal Once   memantine   10 mg Oral BID   pantoprazole  (PROTONIX ) IV  40 mg Intravenous Q24H   sertraline   50 mg Oral Daily    PRN meds: acetaminophen  **OR** acetaminophen , albuterol , bisacodyl , hydrALAZINE , HYDROmorphone  (DILAUDID ) injection, ondansetron  (ZOFRAN ) IV, polyethylene glycol   Infusions:   sodium chloride  100 mL/hr at 04/23/24 1113     Antimicrobials: Anti-infectives (From admission, onward)    None       Objective: Vitals:   04/22/24 2032 04/23/24 0532  BP: (!) 143/84 (!) 141/79  Pulse: 77 75  Resp: 20 20  Temp: 97.8 F (36.6 C) 98.2 F (36.8 C)  SpO2: 98%     Intake/Output Summary (Last 24 hours) at 04/23/2024 1251 Last data filed at 04/23/2024 1148 Gross per 24 hour  Intake 2730.77 ml  Output 725 ml  Net 2005.77 ml   Filed Weights   04/20/24 1824  Weight: 74.8 kg   Weight change:  Body mass index is 22.38 kg/m.   Physical Exam: General exam: Pleasant, elderly Caucasian male..  NG tube decompression ongoing. Skin: No rashes, lesions or ulcers. HEENT: Atraumatic, normocephalic, no obvious bleeding Lungs: Clear to auscultation bilaterally,  CVS: S1, S2, no murmur,   GI/Abd: Soft, mild diffuse distention, tenderness, bowel sound present,   CNS: Alert, awake, oriented to place  and person Psychiatry: Sad affect Extremities: No pedal edema, no calf tenderness,   Data Review: I have personally reviewed the laboratory data and studies available.  F/u labs ordered Unresulted Labs (From admission, onward)    None       Signed, Chapman Rota, MD [facility] 04/23/2024     "

## 2024-04-24 ENCOUNTER — Inpatient Hospital Stay (HOSPITAL_COMMUNITY)

## 2024-04-24 DIAGNOSIS — R579 Shock, unspecified: Secondary | ICD-10-CM

## 2024-04-24 DIAGNOSIS — N179 Acute kidney failure, unspecified: Secondary | ICD-10-CM

## 2024-04-24 DIAGNOSIS — E876 Hypokalemia: Secondary | ICD-10-CM | POA: Diagnosis not present

## 2024-04-24 DIAGNOSIS — K56609 Unspecified intestinal obstruction, unspecified as to partial versus complete obstruction: Secondary | ICD-10-CM | POA: Diagnosis not present

## 2024-04-24 DIAGNOSIS — J9601 Acute respiratory failure with hypoxia: Secondary | ICD-10-CM | POA: Diagnosis not present

## 2024-04-24 DIAGNOSIS — E8721 Acute metabolic acidosis: Secondary | ICD-10-CM | POA: Diagnosis not present

## 2024-04-24 LAB — CBC
HCT: 42.1 % (ref 39.0–52.0)
Hemoglobin: 14.4 g/dL (ref 13.0–17.0)
MCH: 30.6 pg (ref 26.0–34.0)
MCHC: 34.2 g/dL (ref 30.0–36.0)
MCV: 89.6 fL (ref 80.0–100.0)
Platelets: 170 K/uL (ref 150–400)
RBC: 4.7 MIL/uL (ref 4.22–5.81)
RDW: 14.1 % (ref 11.5–15.5)
WBC: 11.8 K/uL — ABNORMAL HIGH (ref 4.0–10.5)
nRBC: 0 % (ref 0.0–0.2)

## 2024-04-24 LAB — PROCALCITONIN: Procalcitonin: 4.12 ng/mL

## 2024-04-24 LAB — BLOOD GAS, ARTERIAL
Acid-base deficit: 2.1 mmol/L — ABNORMAL HIGH (ref 0.0–2.0)
Acid-base deficit: 8.2 mmol/L — ABNORMAL HIGH (ref 0.0–2.0)
Bicarbonate: 20.9 mmol/L (ref 20.0–28.0)
Bicarbonate: 19.3 mmol/L — ABNORMAL LOW (ref 20.0–28.0)
Drawn by: 74501
FIO2: 80 %
FIO2: 100 %
MECHVT: 620 mL
O2 Content: 15 L/min
O2 Saturation: 87.5 %
O2 Saturation: 91.9 %
PEEP: 8 cmH2O
Patient temperature: 37
Patient temperature: 39.5
RATE: 20 {breaths}/min
pCO2 arterial: 30 mmHg — ABNORMAL LOW (ref 32–48)
pCO2 arterial: 51 mmHg — ABNORMAL HIGH (ref 32–48)
pH, Arterial: 7.45 (ref 7.35–7.45)
pH, Arterial: 7.2 — ABNORMAL LOW (ref 7.35–7.45)
pO2, Arterial: 51 mmHg — ABNORMAL LOW (ref 83–108)
pO2, Arterial: 78 mmHg — ABNORMAL LOW (ref 83–108)

## 2024-04-24 LAB — BASIC METABOLIC PANEL WITH GFR
Anion gap: 11 (ref 5–15)
BUN: 28 mg/dL — ABNORMAL HIGH (ref 8–23)
CO2: 23 mmol/L (ref 22–32)
Calcium: 8.5 mg/dL — ABNORMAL LOW (ref 8.9–10.3)
Chloride: 108 mmol/L (ref 98–111)
Creatinine, Ser: 0.74 mg/dL (ref 0.61–1.24)
GFR, Estimated: >60 mL/min
Glucose, Bld: 103 mg/dL — ABNORMAL HIGH (ref 70–99)
Potassium: 3.4 mmol/L — ABNORMAL LOW (ref 3.5–5.1)
Sodium: 142 mmol/L (ref 135–145)

## 2024-04-24 LAB — PROTIME-INR
INR: 1.1 (ref 0.8–1.2)
Prothrombin Time: 15.1 s (ref 11.4–15.2)

## 2024-04-24 LAB — COMPREHENSIVE METABOLIC PANEL WITH GFR
ALT: 23 U/L (ref 0–44)
AST: 43 U/L — ABNORMAL HIGH (ref 15–41)
Albumin: 3.3 g/dL — ABNORMAL LOW (ref 3.5–5.0)
Alkaline Phosphatase: 81 U/L (ref 38–126)
Anion gap: 17 — ABNORMAL HIGH (ref 5–15)
BUN: 32 mg/dL — ABNORMAL HIGH (ref 8–23)
CO2: 20 mmol/L — ABNORMAL LOW (ref 22–32)
Calcium: 8.7 mg/dL — ABNORMAL LOW (ref 8.9–10.3)
Chloride: 106 mmol/L (ref 98–111)
Creatinine, Ser: 1.41 mg/dL — ABNORMAL HIGH (ref 0.61–1.24)
GFR, Estimated: 49 mL/min — ABNORMAL LOW
Glucose, Bld: 101 mg/dL — ABNORMAL HIGH (ref 70–99)
Potassium: 3.4 mmol/L — ABNORMAL LOW (ref 3.5–5.1)
Sodium: 143 mmol/L (ref 135–145)
Total Bilirubin: 2.5 mg/dL — ABNORMAL HIGH (ref 0.0–1.2)
Total Protein: 6.6 g/dL (ref 6.5–8.1)

## 2024-04-24 LAB — CBC WITH DIFFERENTIAL/PLATELET

## 2024-04-24 LAB — GLUCOSE, CAPILLARY
Glucose-Capillary: 101 mg/dL — ABNORMAL HIGH (ref 70–99)
Glucose-Capillary: 92 mg/dL (ref 70–99)

## 2024-04-24 LAB — MRSA NEXT GEN BY PCR, NASAL: MRSA by PCR Next Gen: NOT DETECTED

## 2024-04-24 LAB — PRO BRAIN NATRIURETIC PEPTIDE: Pro Brain Natriuretic Peptide: 3086 pg/mL — ABNORMAL HIGH

## 2024-04-24 MED ORDER — DIATRIZOATE MEGLUMINE & SODIUM 66-10 % PO SOLN: 90.0000 mL | Freq: Once | ORAL | Status: DC

## 2024-04-24 MED ORDER — VASOPRESSIN 20 UNITS/100 ML INFUSION FOR SHOCK
0.0000 [IU]/min | INTRAVENOUS | Status: DC
  Administered 2024-04-24: 0.04 [IU]/min via INTRAVENOUS

## 2024-04-24 MED ORDER — NOREPINEPHRINE 4 MG/250ML-% IV SOLN
0.0000 ug/min | INTRAVENOUS | Status: DC
  Administered 2024-04-24: 35 ug/min via INTRAVENOUS
  Administered 2024-04-24: 10 ug/min via INTRAVENOUS
  Filled 2024-04-24: qty 250

## 2024-04-24 MED ORDER — ETOMIDATE 2 MG/ML IV SOLN
INTRAVENOUS | Status: AC
  Administered 2024-04-24: 20 mg
  Filled 2024-04-24: qty 10

## 2024-04-24 MED ORDER — ETOMIDATE 2 MG/ML IV SOLN
INTRAVENOUS | Status: AC
  Administered 2024-04-24: 20 mg
  Filled 2024-04-24: qty 20

## 2024-04-24 MED ORDER — PANTOPRAZOLE SODIUM 40 MG PO TBEC
40.0000 mg | DELAYED_RELEASE_TABLET | Freq: Every day | ORAL | Status: DC
  Administered 2024-04-24: 40 mg via ORAL
  Filled 2024-04-24: qty 1

## 2024-04-24 MED ORDER — FENTANYL CITRATE (PF) 50 MCG/ML IJ SOSY: 50.0000 ug | PREFILLED_SYRINGE | INTRAMUSCULAR | Status: DC | PRN

## 2024-04-24 MED ORDER — POTASSIUM CHLORIDE CRYS ER 20 MEQ PO TBCR
40.0000 meq | EXTENDED_RELEASE_TABLET | Freq: Once | ORAL | Status: AC
  Administered 2024-04-24: 40 meq via ORAL
  Filled 2024-04-24: qty 2

## 2024-04-24 MED ORDER — BISACODYL 5 MG PO TBEC
5.0000 mg | DELAYED_RELEASE_TABLET | Freq: Every day | ORAL | Status: DC
  Administered 2024-04-24: 5 mg via ORAL
  Filled 2024-04-24: qty 1

## 2024-04-24 MED ORDER — ORAL CARE MOUTH RINSE: 15.0000 mL | OROMUCOSAL | Status: DC | PRN

## 2024-04-24 MED ORDER — DIATRIZOATE MEGLUMINE & SODIUM 66-10 % PO SOLN
90.0000 mL | Freq: Once | ORAL | Status: AC
  Administered 2024-04-24: 90 mL via ORAL
  Filled 2024-04-24: qty 90

## 2024-04-24 MED ORDER — POLYETHYLENE GLYCOL 3350 17 G PO PACK: 17.0000 g | PACK | Freq: Two times a day (BID) | ORAL | Status: DC

## 2024-04-24 MED ORDER — FENTANYL 2500MCG IN NS 250ML (10MCG/ML) PREMIX INFUSION
0.0000 ug/h | INTRAVENOUS | Status: DC
  Administered 2024-04-24: 25 ug/h via INTRAVENOUS
  Filled 2024-04-24: qty 250

## 2024-04-24 MED ORDER — VANCOMYCIN HCL 1500 MG/300ML IV SOLN
1500.0000 mg | Freq: Once | INTRAVENOUS | Status: DC
  Administered 2024-04-24: 1500 mg via INTRAVENOUS
  Filled 2024-04-24: qty 300

## 2024-04-24 MED ORDER — SUCCINYLCHOLINE CHLORIDE 200 MG/10ML IV SOSY
PREFILLED_SYRINGE | INTRAVENOUS | Status: AC
  Filled 2024-04-24: qty 10

## 2024-04-24 MED ORDER — ORAL CARE MOUTH RINSE: 15.0000 mL | OROMUCOSAL | Status: DC

## 2024-04-24 MED ORDER — SENNA 8.6 MG PO TABS: 1.0000 | ORAL_TABLET | Freq: Two times a day (BID) | ORAL | Status: DC

## 2024-04-24 MED ORDER — FENTANYL CITRATE (PF) 50 MCG/ML IJ SOSY
50.0000 ug | PREFILLED_SYRINGE | INTRAMUSCULAR | Status: DC | PRN
  Administered 2024-04-24 (×2): 50 ug via INTRAVENOUS

## 2024-04-24 MED ORDER — SODIUM BICARBONATE 8.4 % IV SOLN
100.0000 meq | Freq: Once | INTRAVENOUS | Status: AC
  Administered 2024-04-24: 100 meq via INTRAVENOUS
  Filled 2024-04-24: qty 100

## 2024-04-24 MED ORDER — DEXMEDETOMIDINE HCL IN NACL 200 MCG/50ML IV SOLN
0.0000 ug/kg/h | INTRAVENOUS | Status: DC
  Administered 2024-04-24: 0.4 ug/kg/h via INTRAVENOUS

## 2024-04-24 MED ORDER — FUROSEMIDE 10 MG/ML IJ SOLN
40.0000 mg | Freq: Once | INTRAMUSCULAR | Status: AC
  Administered 2024-04-24: 40 mg via INTRAVENOUS
  Filled 2024-04-24: qty 4

## 2024-04-24 MED ORDER — PHENYLEPHRINE 80 MCG/ML (10ML) SYRINGE FOR IV PUSH (FOR BLOOD PRESSURE SUPPORT)
PREFILLED_SYRINGE | INTRAVENOUS | Status: AC
  Filled 2024-04-24: qty 20

## 2024-04-24 MED ORDER — SODIUM CHLORIDE 0.9 % IV SOLN: 2.0000 g | Freq: Three times a day (TID) | INTRAVENOUS | Status: DC

## 2024-04-24 MED ORDER — DEXMEDETOMIDINE HCL IN NACL 200 MCG/50ML IV SOLN
INTRAVENOUS | Status: AC
  Filled 2024-04-24: qty 50

## 2024-04-24 MED ORDER — ROCURONIUM BROMIDE 10 MG/ML (PF) SYRINGE
PREFILLED_SYRINGE | INTRAVENOUS | Status: AC
  Filled 2024-04-24: qty 10

## 2024-04-24 MED ORDER — HYDROCOD POLI-CHLORPHE POLI ER 10-8 MG/5ML PO SUER
5.0000 mL | Freq: Two times a day (BID) | ORAL | Status: DC | PRN
  Administered 2024-04-24 (×2): 5 mL via ORAL
  Filled 2024-04-24 (×2): qty 5

## 2024-04-24 MED ORDER — PANTOPRAZOLE SODIUM 40 MG IV SOLR: 40.0000 mg | INTRAVENOUS | Status: DC

## 2024-04-24 MED ORDER — NOREPINEPHRINE 4 MG/250ML-% IV SOLN
INTRAVENOUS | Status: AC
  Filled 2024-04-24: qty 250

## 2024-04-24 MED ORDER — MIDAZOLAM HCL 2 MG/2ML IJ SOLN
INTRAMUSCULAR | Status: AC
  Administered 2024-04-24: 2 mg
  Filled 2024-04-24: qty 2

## 2024-04-24 MED ORDER — MELATONIN 5 MG PO TABS: 5.0000 mg | ORAL_TABLET | Freq: Every day | ORAL | Status: DC

## 2024-04-24 MED ORDER — POLYETHYLENE GLYCOL 3350 17 G PO PACK: 17.0000 g | PACK | Freq: Every day | ORAL | Status: DC

## 2024-04-24 MED ORDER — VASOPRESSIN 20 UNITS/100 ML INFUSION FOR SHOCK
INTRAVENOUS | Status: AC
  Filled 2024-04-24: qty 100

## 2024-04-24 MED ORDER — SODIUM CHLORIDE 0.9 % IV SOLN
3.0000 g | Freq: Four times a day (QID) | INTRAVENOUS | Status: DC
  Administered 2024-04-24: 3 g via INTRAVENOUS
  Filled 2024-04-24 (×3): qty 8

## 2024-04-24 MED ORDER — FENTANYL CITRATE (PF) 100 MCG/2ML IJ SOLN
INTRAMUSCULAR | Status: AC
  Administered 2024-04-24: 100 ug
  Filled 2024-04-24: qty 2

## 2024-04-24 MED ORDER — METRONIDAZOLE 500 MG/100ML IV SOLN: 500.0000 mg | Freq: Two times a day (BID) | INTRAVENOUS | Status: DC

## 2024-04-24 MED ORDER — STERILE WATER FOR INJECTION IV SOLN
INTRAVENOUS | Status: DC
  Filled 2024-04-24: qty 1000

## 2024-04-24 MED ORDER — MIDAZOLAM HCL (PF) 2 MG/2ML IJ SOLN: 1.0000 mg | INTRAMUSCULAR | Status: DC | PRN

## 2024-04-24 MED ORDER — CHLORHEXIDINE GLUCONATE CLOTH 2 % EX PADS
6.0000 | MEDICATED_PAD | Freq: Every day | CUTANEOUS | Status: DC
  Administered 2024-04-24: 6 via TOPICAL

## 2024-04-24 MED ORDER — ENSURE PLUS HIGH PROTEIN PO LIQD: 237.0000 mL | Freq: Two times a day (BID) | ORAL | Status: DC

## 2024-04-24 MED ORDER — KETAMINE HCL 50 MG/5ML IJ SOSY
PREFILLED_SYRINGE | INTRAMUSCULAR | Status: AC
  Filled 2024-04-24: qty 10

## 2024-04-24 MED ORDER — FENTANYL CITRATE (PF) 50 MCG/ML IJ SOSY
PREFILLED_SYRINGE | INTRAMUSCULAR | Status: AC
  Administered 2024-04-24: 50 ug
  Filled 2024-04-24: qty 2

## 2024-04-24 NOTE — TOC PASRR Note (Signed)
 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   The plan is for return home.  Signed: Heather Saltness, MSW, LCSW Clinical Social Worker Inpatient Care Management 04/24/2024 2:19 PM

## 2024-04-24 NOTE — Progress Notes (Signed)
 Mobility Specialist Progress Note:   04/24/24 1523  Mobility  Activity Pivoted/transferred from chair to bed  Level of Assistance Minimal assist, patient does 75% or more  Assistive Device Four wheel walker  Distance Ambulated (ft) 2 ft  Activity Response Tolerated well  Mobility Referral Yes  Mobility visit 1 Mobility  Mobility Specialist Start Time (ACUTE ONLY) 1435  Mobility Specialist Stop Time (ACUTE ONLY) 1458  Mobility Specialist Time Calculation (min) (ACUTE ONLY) 23 min   Pt was received in recliner and requested to get back in bed. Min A x2 sit to stands prior to transfer to strengthen legs. Returned to bed with all needs met. Call bell in reach. Left in room with family,  Andrew Poole - Mobility Specialist

## 2024-04-24 NOTE — Progress Notes (Signed)
 04/24/2024  1800 Patient finished drinking gastrografin  90mL at 1800.

## 2024-04-24 NOTE — Plan of Care (Signed)

## 2024-04-24 NOTE — Progress Notes (Signed)
" °   04/24/24 1910  Assess: MEWS Score  Temp 98.6 F (37 C)  BP (!) 84/58  Pulse Rate (!) 118  Resp (!) 30  Level of Consciousness Alert  SpO2 91 %  O2 Device Non-rebreather Mask  O2 Flow Rate (L/min) 15 L/min  Assess: MEWS Score  MEWS Temp 0  MEWS Systolic 1  MEWS Pulse 2  MEWS RR 2  MEWS LOC 0  MEWS Score 5  MEWS Score Color Red  Assess: if the MEWS score is Yellow or Red  Were vital signs accurate and taken at a resting state? Yes  Does the patient meet 2 or more of the SIRS criteria? No  MEWS guidelines implemented  Yes, red  Treat  MEWS Interventions Considered administering scheduled or prn medications/treatments as ordered  Take Vital Signs  Increase Vital Sign Frequency  Red: Q1hr x2, continue Q4hrs until patient remains green for 12hrs  Escalate  MEWS: Escalate Red: Discuss with charge nurse and notify provider. Consider notifying RRT. If remains red for 2 hours consider need for higher level of care  Provider Notification  Provider Name/Title [place]  Date Provider Notified 04/24/24  Time Provider Notified 1911  Method of Notification Page  Notification Reason Change in status  Provider response No new orders  Date of Provider Response 04/24/24  Time of Provider Response 1911  Notify: Rapid Response  Name of Rapid Response RN Notified Mandy  Date Rapid Response Notified 04/24/24  Time Rapid Response Notified 1911  Assess: SIRS CRITERIA  SIRS Temperature  0  SIRS Respirations  1  SIRS Pulse 1  SIRS WBC 0  SIRS Score Sum  2    "

## 2024-04-24 NOTE — Procedures (Signed)
 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  Consent for the procedure was obtained and is signed in the bedside chart  Anesthesia Topical only with 1% lidocaine    Timeout Verified patient identification, verified procedure, site/side was marked, verified correct patient position, special equipment/implants available, medications/allergies/relevant history reviewed, required imaging and test results available.  Sterile Technique Maximal sterile technique including full sterile barrier drape, hand hygiene, sterile gown, sterile gloves, mask, hair covering, sterile ultrasound probe cover (if used).  Procedure Description Area of catheter insertion was cleaned with chlorhexidine  and draped in sterile fashion.  With real-time ultrasound guidance a central venous catheter was placed into the left internal jugular vein. Nonpulsatile blood flow and easy flushing noted in all ports.  The catheter was sutured in place and sterile dressing applied.  Complications/Tolerance None; patient tolerated the procedure well. Chest X-ray is ordered to verify placement for internal jugular or subclavian cannulation.   Chest x-ray is not ordered for femoral cannulation.  EBL Minimal  Specimen(s) None

## 2024-04-24 NOTE — Significant Event (Signed)
 Patient was seen and examined again this afternoon. He developed shortness of breath and required supplemental oxygen Chest x-ray showed diffuse bilateral mid to lower lung per interstitial densities concerning for pneumonia.  On my exam, patient is on 2 L oxygen.  Family at bedside Family also mentioned that he has a tendency to cough especially after drinking water . Clinically does not look hypervolemic.  Chest x-ray findings could be pneumonia versus fluid. No fever.  WC count normal.  Obtain procalcitonin level.  Start on empiric antibiotic coverage with IV Unasyn  for now I would hold off IV hydration for today.  I will give 1 dose of Lasix  40 mg.  He is not actively throwing up.  Does not have NG suction.  So probably will not be severely dehydrated. I also ordered for speech evaluation given the concern cough on drinking water .  Also, patient does not seem to be having a good sleep at night.  I have ordered for scheduled melatonin tonight  Management of bowel obstruction per general surgery.  Discussed with multiple family members at bedside Discussed with bedside RN

## 2024-04-24 NOTE — Progress Notes (Signed)
 Cross covering ICU physician  Lengthy conversation with family re: pt's critical condition. Escalating pressors, progressive organ failure, profound hypoxic resp failure. Pt tachypneic on vent and was attempting to minimize sedation in light of profound shock. Family has requested prioritization of comfort while continuing with efforts. They expressed understanding that optimizing sedation/comfort will further decrease BP. They will remain at bedside thru the evening, aware that pt may very well expire tonight. He remains DNR without shock as well.   Discussed not adding epi in light of HF and rate of 150's. They expressed undestanding of this as well and are in agreement with norepi and vaso. Will increase to 60mcg as needed to maintain map >65.   RN updated as well.

## 2024-04-24 NOTE — Procedures (Signed)
 Intubation Procedure Note  Andrew Poole, Dr.  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  Consent for the procedure was obtained and is signed in the bedside chart   Anesthesia Etomidate , Versed , Fentanyl , and see mar   Time Out Verified patient identification, verified procedure, site/side was marked, verified correct patient position, special equipment/implants available, medications/allergies/relevant history reviewed, required imaging and test results available.   Sterile Technique Usual hand hygeine, masks, and gloves were used   Procedure Description Patient positioned in bed supine.  Sedation given as noted above.  Patient was intubated with endotracheal tube using Glidescope.  View was Grade 2 only posterior commissure .  Number of attempts was 1.  Colorimetric CO2 detector was consistent with tracheal placement.   Complications/Tolerance Copious GI contents suctioned from oral cavity and lungs. Frank aspiration. Anterior airway req cricoid pressure to visualize posterior cords. Unable to obtain sat via pulse ox prior to intubation greater than 82% with poor pleth. Abg with pao2 50's Chest X-ray is ordered to verify placement.   EBL Minimal   Specimen(s) None

## 2024-04-24 NOTE — Consult Note (Signed)
 "  NAME:  Andrew Poole, Dr., MRN:  [id number], DOB:  [birth year], LOS: 3 ADMISSION DATE:  04/20/2024, CONSULTATION DATE:  04/24/24 REFERRING MD:  [facility], CHIEF COMPLAINT:  acute resp distress/hypoxia   History of Present Illness:  89 yo male presented to hospital 1/9 with abdominal pain. Pt was admitted to [facility] with SBO and being treated for such. Surgery consulted and recommended conservative therapy with NG, IVF, pain meds, antiemetics. Pt was in his normal state of health until evening prior to presentation. + n/v, unsure of when last BM or flatus was. No diarrhea, no fever/chills. No recent sick contacts. Pt does have h/o appendectomy and cholecystectomy but never had history of sbo.   During pt's hospitalization he pulled out his ng during periods of confusion. Was started on clear liquids and had repeated emesis. He was to undergo a small bowel protocol and replacement of NG in hopes of avoiding surgery.   Unfortunately this evening pt decompensated on the floor, suspected aspiration and subsequent increased wob, hypoxia req HHFNC 60L 100% and transfer to ICU.   Pt was originally DNR/DNI but rescinded his desire to not be intubated. CCM was consulted for eval.   Upon arrival to ICU pt was hypoxic to the low 80's on max hhfnc. He was utilizing accessory muscles for breathing. He was unable to speak in full sentences. Daughters at bedside and endorse his desire for intubation.     Pertinent  Medical History  H/o cholecystectomy H/o appendectomy  Significant Hospital Events: Including procedures, antibiotic start and stop dates in addition to other pertinent events   Admitted 1/9 Transferred to ICU 1/13 Intubated/cvc/aline placed 1/13  Interim History / Subjective:    Objective    Blood pressure (!) 95/52, pulse (!) 137, temperature (!) 101.1 F (38.4 C), temperature source Axillary, resp. rate (!) 35, height 6' (1.829 m), weight 82.8 kg, SpO2 91%.    FiO2 (%):  [95 %] 95 %    Intake/Output Summary (Last 24 hours) at 04/24/2024 2132 Last data filed at 04/24/2024 1846 Gross per 24 hour  Intake 1240.66 ml  Output --  Net 1240.66 ml   Filed Weights   04/20/24 1824 04/24/24 2022  Weight: 74.8 kg 82.8 kg    Examination: General: acutely ill appearing male, sitting up in acute distress HENT: ncat eomi, perrla, mmdry but pink Lungs: rhonchi bilaterally Cardiovascular: tachycardic Abdomen: distended, ttp, no rebound + guarding bs severely diminished Extremities: no c/c/e Neuro: awake, in distress and not following commands but does converse GU: deferred  Resolved problem list   Assessment and Plan  Acute hypoxic resp failure req mechanical ventilation Aspiration event Sbo Shock, presumed septic +cardiogenic Hypokalemia Metabolic acidosis aki  -titrate vent as able on 100% 8 peep with sats in mid 80's -titrate vasopressor to map >65 -pad -add vaso -broaden coverage for intra-abdominal process and aspiration event -check lactate -place cvc -place arterial line -cxr pending -repeat abg pending -cx ngtd -if can stabilize will consider repeat abdominal/pelvic imaging -echo in am -og tube to suction -follow renal function and I/o -cont goc conversation   Labs   CBC: Recent Labs  Lab 04/20/24 1128 04/21/24 0601 04/22/24 0549 04/23/24 0528 04/24/24 1321  WBC 5.2 9.4 9.4 7.5 11.8*  NEUTROABS  --   --  7.9* 6.3  --   HGB 14.7 12.9* 14.4 13.5 14.4  HCT 43.0 38.5* 43.3 40.7 42.1  MCV 88.8 91.2 91.7 90.4 89.6  PLT 163 145* 139* 143* 170  Basic Metabolic Panel: Recent Labs  Lab 04/20/24 1128 04/21/24 0601 04/22/24 0549 04/23/24 0528 04/24/24 1321  NA 139 143 146* 145 142  K 3.6 4.3 5.2* 3.5 3.4*  CL 102 106 109 109 108  CO2 22 29 27 25 23   GLUCOSE 165* 118* 132* 123* 103*  BUN 18 24* 35* 31* 28*  CREATININE 0.73 1.03 1.05 0.75 0.74  CALCIUM  8.6* 8.6* 9.2 8.8* 8.5*  MG  --   --  2.8*  --   --   PHOS  --   --  2.1*  --   --     GFR: Estimated Creatinine Clearance: 72.8 mL/min (by C-G formula based on SCr of 0.74 mg/dL). Recent Labs  Lab 04/21/24 0601 04/22/24 0549 04/23/24 0528 04/24/24 1321  WBC 9.4 9.4 7.5 11.8*    Liver Function Tests: Recent Labs  Lab 04/20/24 1128  AST 36  ALT 34  ALKPHOS 67  BILITOT 0.6  PROT 6.9  ALBUMIN 4.0   Recent Labs  Lab 04/20/24 1128  LIPASE 25   No results for input(s): AMMONIA in the last 168 hours.  ABG    Component Value Date/Time   PHART 7.45 04/24/2024 1900   PCO2ART 30 (L) 04/24/2024 1900   PO2ART 51 (L) 04/24/2024 1900   HCO3 20.9 04/24/2024 1900   ACIDBASEDEF 2.1 (H) 04/24/2024 1900   O2SAT 87.5 04/24/2024 1900     Coagulation Profile: Recent Labs  Lab 04/24/24 2058  INR 1.1    Cardiac Enzymes: No results for input(s): CKTOTAL, CKMB, CKMBINDEX, TROPONINI in the last 168 hours.  HbA1C: Hgb A1c MFr Bld  Date/Time Value Ref Range Status  05/17/2023 10:50 AM 5.3 4.8 - 5.6 % Final    Comment:             Prediabetes: 5.7 - 6.4          Diabetes: >6.4          Glycemic control for adults with diabetes: <7.0     CBG: Recent Labs  Lab 04/24/24 2031  GLUCAP 92    Review of Systems:   Minimally obtainable 2/2 resp distress  Past Medical History:  He,  has no past medical history on file.   Surgical History:  History reviewed. No pertinent surgical history.   Social History:   reports that he quit smoking about 49 years ago. His smoking use included cigarettes. He has never used smokeless tobacco. He reports current alcohol  use of about 2.0 standard drinks of alcohol  per week. He reports that he does not use drugs.   Family History:  His family history is not on file.   Allergies Allergies[1]   Home Medications  Prior to Admission medications  Medication Sig Start Date End Date Taking? Authorizing Provider  amLODipine  (NORVASC ) 2.5 MG tablet Take 1 tablet (2.5 mg total) by mouth daily. 04/16/24  Yes Amin, Saad,  MD  ARIPiprazole  (ABILIFY ) 2 MG tablet Take 1 tablet (2 mg total) by mouth daily. 01/09/24  Yes Amin, Saad, MD  aspirin EC 81 MG tablet Take 81 mg by mouth daily.   Yes [provider]  atorvastatin  (LIPITOR) 10 MG tablet Take 1 tablet (10 mg total) by mouth daily. 06/21/23  Yes Amin, Saad, MD  Cholecalciferol  (VITAMIN D3) 125 MCG (5000 UT) CAPS Take 1 capsule (5,000 Units total) by mouth daily. 08/02/23  Yes Amin, Saad, MD  furosemide  (LASIX ) 20 MG tablet Take 1 tablet (20 mg total) by mouth daily. 08/02/23  Yes  Caleen Dirks, MD  memantine  (NAMENDA ) 10 MG tablet Take 1 tablet (10 mg total) by mouth 2 (two) times daily. 12/22/23  Yes Amin, Saad, MD  sertraline  (ZOLOFT ) 50 MG tablet Take 1 tablet (50 mg total) by mouth daily. 12/02/23  Yes Amin, Saad, MD  donepezil  (ARICEPT ) 5 MG tablet Take 1 tablet (5 mg total) by mouth at bedtime. Patient not taking: Reported on 04/21/2024 11/22/23   Amin, Saad, MD  fluorometholone  (FML) 0.1 % ophthalmic suspension Place 1 drop into the right eye 4 (four) times daily for 7 days.Start after surgery. Patient not taking: Reported on 04/21/2024 09/23/23     Multiple Vitamin (MULTI-VITAMIN) tablet Take 1 tablet by mouth daily. Patient not taking: Reported on 04/21/2024    [provider]     Critical care time:               [1] No Known Allergies  "

## 2024-04-24 NOTE — Procedures (Signed)
 Arterial Catheter Insertion Procedure Note  Axell Trigueros, Dr.  [id number]  04-05-1936  Date:04/24/2024  Time:9:31 PM    Provider Performing: Harlene Na    Procedure: Insertion of Arterial Line (63379) with US  guidance (23062)   Indication(s) Blood pressure monitoring and/or need for frequent ABGs  Consent Risks of the procedure as well as the alternatives and risks of each were explained to the patient and/or caregiver.  Consent for the procedure was obtained and is signed in the bedside chart  Anesthesia None   Time Out Verified patient identification, verified procedure, site/side was marked, verified correct patient position, special equipment/implants available, medications/allergies/relevant history reviewed, required imaging and test results available.   Sterile Technique Maximal sterile technique including full sterile barrier drape, hand hygiene, sterile gown, sterile gloves, mask, hair covering, sterile ultrasound probe cover (if used).   Procedure Description Area of catheter insertion was cleaned with chlorhexidine  and draped in sterile fashion. With real-time ultrasound guidance an arterial catheter was placed into the left radial artery.  Appropriate arterial tracings confirmed on monitor.     Complications/Tolerance None; patient tolerated the procedure well.   EBL Minimal   Specimen(s) None

## 2024-04-24 NOTE — Progress Notes (Incomplete)
 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  Pharmacy has been consulted for Vancomycin  dosing.  Plan:   Height: 6' (182.9 cm) Weight: 82.8 kg (182 lb 8.7 oz) IBW/kg (Calculated) : 77.6  Temp (24hrs), Avg:101.8 F (38.8 C), Min:97.3 F (36.3 C), Max:104.2 F (40.1 C)  Recent Labs  Lab 04/21/24 0601 04/22/24 0549 04/23/24 0528 04/24/24 1321 04/24/24 2058 04/24/24 2254  WBC 9.4 9.4 7.5 11.8*  --  10.0  CREATININE 1.03 1.05 0.75 0.74 1.41*  --     Estimated Creatinine Clearance: 41.3 mL/min (A) (by C-G formula based on SCr of 1.41 mg/dL (H)).    Allergies[1]  Antimicrobials this admission: 1/13 Unasyn  x1 1/13 Vancomycin  >>  2024/05/12 Cefepime >> 2024/05/12 Flagyl >>  Dose adjustments this admission:  Microbiology results: *** BCx: *** *** UCx: ***  *** Sputum: ***  1/13 MRSA PCR: negative  Thank you for allowing pharmacy to be a part of this patients care.  Rosaline Millet PharmD 04/24/2024 11:56 PM       [1] No Known Allergies

## 2024-04-24 NOTE — Progress Notes (Signed)
 Mobility Specialist Progress Note:   04/24/24 1240  Mobility  Activity  (Chair Exercises)  Level of Assistance Independent  Range of Motion/Exercises Active  Activity Response Tolerated well  Mobility Referral Yes  Mobility visit 1 Mobility  Mobility Specialist Start Time (ACUTE ONLY) 1208  Mobility Specialist Stop Time (ACUTE ONLY) 1223  Mobility Specialist Time Calculation (min) (ACUTE ONLY) 15 min   Pt was received in recliner and agreed to mobility: Seated BLE Exercises: 5 reps each  1) Toe Raise/ Heel Raise:  2) Knee Extension  3) Ankle Pumps  4) Hip Adduction (pillow squeezes)   Pt had slight pain in lower abdomen but continued session. Returned to recliner with all needs met. Call bell in reach.  [employer] - Mobility Specialist

## 2024-04-24 NOTE — Progress Notes (Signed)
" °   04/24/24 2300  Spiritual Encounters  Type of Visit Follow up  Care provided to: Pt and family  Conversation partners present during encounter Nurse  Referral source Clinical staff  Reason for visit End-of-life  OnCall Visit Yes  Spiritual Framework  Presenting Themes Meaning/purpose/sources of inspiration;Values and beliefs;Significant life change;Courage hope and growth;Impactful experiences and emotions;Rituals and practive  Community/Connection Family  Patient Stress Factors None identified  Family Stress Factors Exhausted;Loss of control;Major life changes;Loss (Anticipatory Grief)  Interventions  Spiritual Care Interventions Made Established relationship of care and support;Compassionate presence;Reflective listening;Normalization of emotions;Explored ethical dilemma;Supported grief process;Provided grief education;Prayer  Intervention Outcomes  Outcomes Connection to spiritual care;Awareness around self/spiritual resourses;Awareness of support;Reduced anxiety;Reduced fear  Spiritual Care Plan  Spiritual Care Issues Still Outstanding Chaplain will continue to follow   Evening Chaplain met with patient and family at bedside - Provided end of life comfort and counsel to two daughters and son in law present and four sisblings on telephone conference call.  Family spoke of their love for their Fther and requested prayer.  Chaplain anointed Andrew Poole, the patient, and spoke words of prayer and comfort to patient and family.  Provided support to family and staf in hospital hallways at this time as well.  Ended visit with a departing blessing.   Rev. Mallie Dennis Molt  "

## 2024-04-24 NOTE — Progress Notes (Signed)
" °   04/24/24 1521  Assess: MEWS Score  Temp (!) 97.5 F (36.4 C)  BP 123/76  MAP (mmHg) 90  Pulse Rate (!) 108  ECG Heart Rate (!) 105  Resp (!) 32  Level of Consciousness Alert  SpO2 (!) 85 %  O2 Device Room Air  Assess: MEWS Score  MEWS Temp 0  MEWS Systolic 0  MEWS Pulse 1  MEWS RR 2  MEWS LOC 0  MEWS Score 3  MEWS Score Color Yellow  Assess: if the MEWS score is Yellow or Red  Were vital signs accurate and taken at a resting state? Yes  Does the patient meet 2 or more of the SIRS criteria? No  MEWS guidelines implemented  Yes, yellow  Treat  MEWS Interventions Considered administering scheduled or prn medications/treatments as ordered  Take Vital Signs  Increase Vital Sign Frequency  Yellow: Q2hr x1, continue Q4hrs until patient remains green for 12hrs  Escalate  MEWS: Escalate Yellow: Discuss with charge nurse and consider notifying provider and/or RRT  Provider Notification  Provider Name/Title Dahal  Date Provider Notified 04/24/24  Time Provider Notified 1540  Method of Notification  (secure chat)  Notification Reason Other (Comment) (Yellow mews)  Provider response See new orders  Date of Provider Response 04/24/24  Time of Provider Response 1541  Notify: Rapid Response  Date Rapid Response Notified 04/24/24  Assess: SIRS CRITERIA  SIRS Temperature  0  SIRS Respirations  1  SIRS Pulse 1  SIRS WBC 0  SIRS Score Sum  2    "

## 2024-04-24 NOTE — Progress Notes (Signed)
 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  Clinically deteriorating today, now septic with worsening respiratory status & acute kidney injury with Scr 1.41 this evening (baseline <0.8).  PCT 4.12. Broadening antibiotics to Cefepime + Vancomycin  + Flagyl   Plan: Cefepime 2gm IV q12h Vancomycin  1500mg  IV x1 then 1250mg  IV q24h for estimated AUC 542 (goal 400-550) Monitor renal function and cx data    Height: 6' (182.9 cm) Weight: 82.8 kg (182 lb 8.7 oz) IBW/kg (Calculated) : 77.6  Temp (24hrs), Avg:101.8 F (38.8 C), Min:97.3 F (36.3 C), Max:104.2 F (40.1 C)  Recent Labs  Lab 04/21/24 0601 04/22/24 0549 04/23/24 0528 04/24/24 1321 04/24/24 2058 04/24/24 2254  WBC 9.4 9.4 7.5 11.8*  --  10.0  CREATININE 1.03 1.05 0.75 0.74 1.41*  --     Estimated Creatinine Clearance: 41.3 mL/min (A) (by C-G formula based on SCr of 1.41 mg/dL (H)).    Allergies[1]  Antimicrobials this admission: 1/13 Unasyn  x1 1/13 Vancomycin  >>  05/22/2024 Cefepime >> 2024-05-22 Flagyl >>  Dose adjustments this admission:  Microbiology results: 1/13 MRSA PCR: negative  Thank you for allowing pharmacy to be a part of this patients care.  Rosaline Millet PharmD 04/24/2024 11:56 PM     [1] No Known Allergies

## 2024-04-24 NOTE — Progress Notes (Signed)
" °   04/24/24 1345  Vitals  Temp (!) 97.3 F (36.3 C)  Temp Source Oral  BP 135/73  MAP (mmHg) 90  BP Location Left Wrist  BP Method Automatic  Patient Position (if appropriate) Sitting  Pulse Rate (!) 46  Pulse Rate Source Monitor  Resp (!) 32  Level of Consciousness  Level of Consciousness Alert  Oxygen Therapy  SpO2 (!) 88 %  O2 Device Room Air  MEWS Score  MEWS Temp 0  MEWS Systolic 0  MEWS Pulse 1  MEWS RR 2  MEWS LOC 0  MEWS Score 3  MEWS Score Color Yellow  Rapid Response Notification  Name of Rapid Response RN Notified Omega  Date Rapid Response Notified 04/24/24  Time Rapid Response Notified 1630    "

## 2024-04-24 NOTE — Progress Notes (Signed)
" °   04/24/24 1521  Vitals  Temp (!) 97.5 F (36.4 C)  Temp Source Oral  BP 123/76  MAP (mmHg) 90  BP Location Left Arm  BP Method Automatic  Patient Position (if appropriate) Sitting  Pulse Rate (!) 108  Pulse Rate Source Monitor  ECG Heart Rate (!) 105  Resp (!) 32  Level of Consciousness  Level of Consciousness Alert  Oxygen Therapy  SpO2 (!) 85 %  O2 Device Room Air  MEWS Score  MEWS Temp 0  MEWS Systolic 0  MEWS Pulse 1  MEWS RR 2  MEWS LOC 0  MEWS Score 3  MEWS Score Color Yellow  Provider Notification  Provider Name/Title Dahal  Date Provider Notified 04/24/24  Time Provider Notified 1540  Method of Notification  (secure chat)  Notification Reason Other (Comment) (Yellow mews)  Provider response See new orders  Date of Provider Response 04/24/24  Time of Provider Response 1541  Rapid Response Notification  Date Rapid Response Notified 04/24/24    "

## 2024-04-24 NOTE — Progress Notes (Addendum)
 eLink Physician-Brief Progress Note Patient Name: Andrew Poole, Dr. DOB: [birth year] MRN: [id number]   Date of Service  04/24/2024  HPI/Events of Note  89 year old male with a history of dementia, metabolic syndrome, and small hiatal hernia.  Initially presented to the hospital for evaluation of small bowel obstruction transferred to the ICU in the setting of respiratory distress and shock.  Vitals, labs, and imaging reviewed.  The patient was mechanically ventilated requiring norepinephrine  and vasopressin  infusions.  Has evidence of acute kidney injury  eICU Interventions  Advance endotracheal tube additional centimeter  Maintain mechanical ventilation, daily SAT/SBT  Vasopressor support as needed to maintain MAP greater than 65.  Diuresed earlier, may have some component of intravascular depletion.  Broad-spectrum antibiotics  DNR noted DVT prophylaxis with enoxaparin  GI prophylaxis with pantoprazole    2331 -increase ceiling of norepinephrine , shared decision lab to add epinephrine.  Add bicarb infusion, goal pH greater than 7.25  Intervention Category Evaluation Type: New Patient Evaluation  Hiliana Eilts 04/24/2024, 9:35 PM

## 2024-04-24 NOTE — TOC Progression Note (Addendum)
 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 Pt's daughter agreeable with recommendation. Daughter reports facility preference is [facility]. CSW completed FL2 and sent referrals to SNFs in [place] and surrounding areas. CSW sent message to [place] at [facility] requesting to review referral. Currently pending bed offers. Level II PASRR pending, documents uploaded to [place] MUST. TOC will continue to follow.   Expected Discharge Plan: Home/Self Care Barriers to Discharge: Continued Medical Work up   Expected Discharge Plan and Services In-house Referral: Clinical Social Work Discharge Planning Services: NA Post Acute Care Choice: Resumption of Svcs/PTA Provider Living arrangements for the past 2 months: Independent Living Facility                 DME Arranged: N/A DME Agency: NA       HH Arranged: NA HH Agency: NA         Social Drivers of Health (SDOH) Interventions SDOH Screenings   Food Insecurity: No Food Insecurity (04/20/2024)  Housing: Low Risk (04/20/2024)  Transportation Needs: No Transportation Needs (04/20/2024)  Utilities: Not At Risk (04/20/2024)  Depression (PHQ2-9): Low Risk (08/02/2023)  Social Connections: Moderately Integrated (04/20/2024)  Tobacco Use: Medium Risk (04/20/2024)    Readmission Risk Interventions    04/24/2024    2:26 PM  Readmission Risk Prevention Plan  Post Dischage Appt Complete  Medication Screening Complete  Transportation Screening Complete   Signed: Heather Saltness, MSW, LCSW Clinical Social Worker Inpatient Care Management 04/24/2024 2:27 PM

## 2024-04-24 NOTE — Consult Note (Incomplete)
 "  NAME:  Andrew Poole, Dr., MRN:  [id number], DOB:  09/09/1935, LOS: 3 ADMISSION DATE:  04/20/2024, CONSULTATION DATE:  04/24/24 REFERRING MD:  [facility], CHIEF COMPLAINT:  acute resp distress/hypoxia   History of Present Illness:  89 yo male presented to hospital 1/9 with abdominal pain. Pt was admitted to [facility] with SBO and being treated for such.   Pertinent  Medical History  ***  Significant Hospital Events: Including procedures, antibiotic start and stop dates in addition to other pertinent events     Interim History / Subjective:  ***  Objective    Blood pressure (!) 95/52, pulse (!) 137, temperature (!) 101.1 F (38.4 C), temperature source Axillary, resp. rate (!) 35, height 6' (1.829 m), weight 82.8 kg, SpO2 91%.    FiO2 (%):  [95 %] 95 %   Intake/Output Summary (Last 24 hours) at 04/24/2024 2132 Last data filed at 04/24/2024 1846 Gross per 24 hour  Intake 1240.66 ml  Output --  Net 1240.66 ml   Filed Weights   04/20/24 1824 04/24/24 2022  Weight: 74.8 kg 82.8 kg    Examination: General: *** HENT: *** Lungs: *** Cardiovascular: *** Abdomen: *** Extremities: *** Neuro: *** GU: ***  Resolved problem list   Assessment and Plan     Labs   CBC: Recent Labs  Lab 04/20/24 1128 04/21/24 0601 04/22/24 0549 04/23/24 0528 04/24/24 1321  WBC 5.2 9.4 9.4 7.5 11.8*  NEUTROABS  --   --  7.9* 6.3  --   HGB 14.7 12.9* 14.4 13.5 14.4  HCT 43.0 38.5* 43.3 40.7 42.1  MCV 88.8 91.2 91.7 90.4 89.6  PLT 163 145* 139* 143* 170    Basic Metabolic Panel: Recent Labs  Lab 04/20/24 1128 04/21/24 0601 04/22/24 0549 04/23/24 0528 04/24/24 1321  NA 139 143 146* 145 142  K 3.6 4.3 5.2* 3.5 3.4*  CL 102 106 109 109 108  CO2 22 29 27 25 23   GLUCOSE 165* 118* 132* 123* 103*  BUN 18 24* 35* 31* 28*  CREATININE 0.73 1.03 1.05 0.75 0.74  CALCIUM  8.6* 8.6* 9.2 8.8* 8.5*  MG  --   --  2.8*  --   --   PHOS  --   --  2.1*  --   --    GFR: Estimated Creatinine Clearance:  72.8 mL/min (by C-G formula based on SCr of 0.74 mg/dL). Recent Labs  Lab 04/21/24 0601 04/22/24 0549 04/23/24 0528 04/24/24 1321  WBC 9.4 9.4 7.5 11.8*    Liver Function Tests: Recent Labs  Lab 04/20/24 1128  AST 36  ALT 34  ALKPHOS 67  BILITOT 0.6  PROT 6.9  ALBUMIN 4.0   Recent Labs  Lab 04/20/24 1128  LIPASE 25   No results for input(s): AMMONIA in the last 168 hours.  ABG    Component Value Date/Time   PHART 7.45 04/24/2024 1900   PCO2ART 30 (L) 04/24/2024 1900   PO2ART 51 (L) 04/24/2024 1900   HCO3 20.9 04/24/2024 1900   ACIDBASEDEF 2.1 (H) 04/24/2024 1900   O2SAT 87.5 04/24/2024 1900     Coagulation Profile: Recent Labs  Lab 04/24/24 2058  INR 1.1    Cardiac Enzymes: No results for input(s): CKTOTAL, CKMB, CKMBINDEX, TROPONINI in the last 168 hours.  HbA1C: Hgb A1c MFr Bld  Date/Time Value Ref Range Status  05/17/2023 10:50 AM 5.3 4.8 - 5.6 % Final    Comment:  Prediabetes: 5.7 - 6.4          Diabetes: >6.4          Glycemic control for adults with diabetes: <7.0     CBG: Recent Labs  Lab 04/24/24 2031  GLUCAP 92    Review of Systems:   ***  Past Medical History:  He,  has no past medical history on file.   Surgical History:  History reviewed. No pertinent surgical history.   Social History:   reports that he quit smoking about 49 years ago. His smoking use included cigarettes. He has never used smokeless tobacco. He reports current alcohol  use of about 2.0 standard drinks of alcohol  per week. He reports that he does not use drugs.   Family History:  His family history is not on file.   Allergies Allergies[1]   Home Medications  Prior to Admission medications  Medication Sig Start Date End Date Taking? Authorizing Provider  amLODipine  (NORVASC ) 2.5 MG tablet Take 1 tablet (2.5 mg total) by mouth daily. 04/16/24  Yes Amin, Saad, MD  ARIPiprazole  (ABILIFY ) 2 MG tablet Take 1 tablet (2 mg total) by mouth  daily. 01/09/24  Yes Amin, Saad, MD  aspirin EC 81 MG tablet Take 81 mg by mouth daily.   Yes [provider]  atorvastatin  (LIPITOR) 10 MG tablet Take 1 tablet (10 mg total) by mouth daily. 06/21/23  Yes Amin, Saad, MD  Cholecalciferol  (VITAMIN D3) 125 MCG (5000 UT) CAPS Take 1 capsule (5,000 Units total) by mouth daily. 08/02/23  Yes Amin, Saad, MD  furosemide  (LASIX ) 20 MG tablet Take 1 tablet (20 mg total) by mouth daily. 08/02/23  Yes Amin, Saad, MD  memantine  (NAMENDA ) 10 MG tablet Take 1 tablet (10 mg total) by mouth 2 (two) times daily. 12/22/23  Yes Amin, Saad, MD  sertraline  (ZOLOFT ) 50 MG tablet Take 1 tablet (50 mg total) by mouth daily. 12/02/23  Yes Amin, Saad, MD  donepezil  (ARICEPT ) 5 MG tablet Take 1 tablet (5 mg total) by mouth at bedtime. Patient not taking: Reported on 04/21/2024 11/22/23   Amin, Saad, MD  fluorometholone  (FML) 0.1 % ophthalmic suspension Place 1 drop into the right eye 4 (four) times daily for 7 days.Start after surgery. Patient not taking: Reported on 04/21/2024 09/23/23     Multiple Vitamin (MULTI-VITAMIN) tablet Take 1 tablet by mouth daily. Patient not taking: Reported on 04/21/2024    [provider]     Critical care time: ***                 [1] No Known Allergies "

## 2024-04-24 NOTE — Progress Notes (Signed)
 " PROGRESS NOTE  Andrew Poole, Dr.  [name]: [birth year]  PCP: Caleen Dirks, MD FMW:[id number]  DOA: 04/20/2024  LOS: 3 days  Hospital Day: 5  Subjective: Patient was seen and examined this morning.   Was seen by general surgery this morning and was advanced from clear to full liquid diet.  But according to the daughter, patient threw up bilious looking emesis after that.   Abdominal x-ray was done and was pending at that time. Patient was on bedside commode trying to have bowel movement.  3 daughters outside the room.  Had a long conversation with daughters.  Afebrile, hemodynamically stable, breathing on room air. 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 Patient lives in an independent living facility, does not use any assistive devices.  1/9, presented to the ED with complaint of abdominal pain, multiple episodes of vomiting since last night.  Unable to recall when the last bowel movement was, suspects in the last few days. No dysuria, hematuria. Reports past h/o appendectomy, cholecystectomy but has not had any history of bowel obstruction since then.  In the ED, patient was afebrile, heart rate 50, blood pressure 128/61, breathing on room air CBC, CMP unremarkable CT abdomen pelvis showed SBO with a right lower quadrant transition zone.  EDP discussed with general surgery.  Recommend NG tube insertion. Started on conservative management of SBO with IV fluid, IV analgesics, antiemetics Admitted to [facility]  Assessment/Plan: Small bowel obstruction Presented with abdominal pain, multiple episodes of vomiting and constipation Reports past h/o appendectomy, cholecystectomy but has not had any history of bowel obstruction since then. On admission, CT abdomen pelvis showed SBO with a right lower quadrant transition zone. Seen by general surgery. Started on conservative management with  NG tube decompression, IV fluid, IV analgesics, IV antiemetics 1/10 abdominal series x-rays did not show any obstructive bowel gas pattern but patient has not passed flatus or had bowel movement yet. 1/11, general surgery recommended to continue NG decompression and bowel rest 1/12, patient accidentally pulled out her NG-tube last night.  Started on clear liquid diet which he tolerated but he still did not have bowel function 1/13, was advanced to full liquid diet today after which patient vomited.  Abdominal x-ray was obtained which showed partial SBO.  Noted a plan from general surgery for NG tube reinsertion. I would continue IV fluid for now.  Repeat labs today. On as needed IV Dilaudid  for pain control  Dehydration  hypernatremia Hyperkalemia Hypophosphatemia 1/11, labs showed elevated sodium, elevated potassium, low phosphorus.   NS rate increased to 100.  Phosphorus replacement was given. 1/12, repeat labs showed normalized sodium and potassium. BUN/creatinine ratio still remains elevated. Continue IV hydration. Continue to monitor labs. 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   Unremarkable so far  Hypertension PTA meds- amlodipine  2.5 mg daily, Lasix  as needed Oral meds currently on hold Blood pressure  in 140s and 150s.  Continue to monitor IV hydralazine  as needed  HLD PTA meds- aspirin, Lipitor -on hold for now.  Dementia PTA meds- Abilify , Namenda , Zoloft  -on hold for now 1/2, resumed at family's request  Small hiatal hernia IV PPI  BPH  CT abdomen noted markedly enlarged prostate gland Will start Flomax once oral intake  improves  Impaired mobility  Seen by PT.  SNF recommended  PT Follow up Rec: Skilled Nursing-Short Term Rehab (<3 Hours/Day)04/23/2024 1410   Goals of care:   Code Status: Limited: Do not attempt resuscitation (DNR) -DNR-LIMITED -Do Not Intubate/DNI .  Confirmed with patient and his daughter at bedside   DVT prophylaxis:  enoxaparin  (LOVENOX ) injection 40 mg Start: 04/20/24 2200   Antimicrobials: None Fluid: NS at 75 ml/hr to continue Consultants: General Surgery Family Communication: Daughters at bedside  Status: Observation Level of care:  Telemetry   Patient is from: Independent living facility Anticipated d/c to: SNF recommended by PT.  Diet:  Diet Order             Diet NPO time specified  Diet effective now                   Scheduled Meds:  ARIPiprazole   2 mg Oral Daily   diatrizoate  meglumine -sodium  90 mL Per NG tube Once   enoxaparin  (LOVENOX ) injection  40 mg Subcutaneous Q24H   feeding supplement  237 mL Oral BID BM   memantine   10 mg Oral BID   pantoprazole  (PROTONIX ) IV  40 mg Intravenous Q24H   sertraline   50 mg Oral Daily    PRN meds: acetaminophen  **OR** acetaminophen , albuterol , bisacodyl , chlorpheniramine-HYDROcodone, hydrALAZINE , HYDROmorphone  (DILAUDID ) injection, ondansetron  (ZOFRAN ) IV   Infusions:   sodium chloride  100 mL/hr at 04/24/24 1057     Antimicrobials: Anti-infectives (From admission, onward)    None       Objective: Vitals:   04/23/24 2047 04/24/24 0555  BP: 120/75 128/81  Pulse: 89 60  Resp: 18 18  Temp: (!) 97.5 F (36.4 C) 98.7 F (37.1 C)  SpO2: 91% 91%    Intake/Output Summary (Last 24 hours) at 04/24/2024 1300 Last data filed at 04/24/2024 0535 Gross per 24 hour  Intake 1132.98 ml  Output 500 ml  Net 632.98 ml   Filed Weights   04/20/24 1824  Weight: 74.8 kg   Weight change:  Body mass index is 22.38 kg/m.   Physical Exam: General exam: Pleasant, elderly Caucasian male. Skin: No rashes,  lesions or ulcers. HEENT: Atraumatic, normocephalic, no obvious bleeding Lungs: Clear to auscultation bilaterally,  CVS: S1, S2, no murmur,   GI/Abd: Soft, mild diffuse distention, mild tenderness, bowel sound present,   CNS: Alert, awake, oriented to place and person Psychiatry: Sad affect Extremities: No pedal edema, no calf tenderness,   Data Review: I have personally reviewed the laboratory data and studies available.  F/u labs ordered Unresulted Labs (From admission, onward)     Start     Ordered   2024/05/22 0500  Magnesium  Tomorrow morning,   R       Question:  Specimen collection method  Answer:  Lab=Lab collect   04/24/24 1258   22-May-2024 0500  Phosphorus  Tomorrow morning,   R       Question:  Specimen collection method  Answer:  Lab=Lab collect   04/24/24 1258   04/24/24 1259  CBC  ONCE - STAT,   STAT       Question:  Specimen collection method  Answer:  Lab=Lab collect   04/24/24 1258   04/24/24 1259  Basic metabolic panel with GFR  ONCE - STAT,   STAT       Question:  Specimen collection method  Answer:  Lab=Lab collect   04/24/24 1258            Signed, Chapman Rota, MD [facility] 04/24/2024     "

## 2024-04-24 NOTE — Progress Notes (Signed)
 "      Subjective: Daughter at bedside.  Said he had a rough night of coughing.  No BM yet.  No nausea and tolerating CLD.   Objective: Vital signs in last 24 hours: Temp:  [97.5 F (36.4 C)-98.7 F (37.1 C)] 98.7 F (37.1 C) (01/13 0555) Pulse Rate:  [60-89] 60 (01/13 0555) Resp:  [18] 18 (01/13 0555) BP: (120-141)/(75-91) 128/81 (01/13 0555) SpO2:  [89 %-95 %] 91 % (01/13 0555) Last BM Date :  (pt does not know)  Intake/Output from previous day: 01/12 0701 - 01/13 0700 In: 3943.8 [I.V.:3943.8] Out: 725 [Urine:725] Intake/Output this shift: No intake/output data recorded.  PE: Gen: Pleasant, lying in his bed Abd: softer than yesterday.  Still mild tenderness in RLQ, but overall ND and stable abdominal exam  Lab Results:  Recent Labs    04/22/24 0549 04/23/24 0528  WBC 9.4 7.5  HGB 14.4 13.5  HCT 43.3 40.7  PLT 139* 143*   BMET Recent Labs    04/22/24 0549 04/23/24 0528  NA 146* 145  K 5.2* 3.5  CL 109 109  CO2 27 25  GLUCOSE 132* 123*  BUN 35* 31*  CREATININE 1.05 0.75  CALCIUM  9.2 8.8*   PT/INR No results for input(s): LABPROT, INR in the last 72 hours. CMP     Component Value Date/Time   NA 145 04/23/2024 0528   NA 142 11/22/2023 1121   K 3.5 04/23/2024 0528   CL 109 04/23/2024 0528   CO2 25 04/23/2024 0528   GLUCOSE 123 (H) 04/23/2024 0528   BUN 31 (H) 04/23/2024 0528   BUN 24 11/22/2023 1121   CREATININE 0.75 04/23/2024 0528   CALCIUM  8.8 (L) 04/23/2024 0528   PROT 6.9 04/20/2024 1128   PROT 6.2 11/22/2023 1121   ALBUMIN 4.0 04/20/2024 1128   ALBUMIN 4.2 11/22/2023 1121   AST 36 04/20/2024 1128   ALT 34 04/20/2024 1128   ALKPHOS 67 04/20/2024 1128   BILITOT 0.6 04/20/2024 1128   BILITOT 0.5 11/22/2023 1121   GFRNONAA >60 04/23/2024 0528   Lipase     Component Value Date/Time   LIPASE 25 04/20/2024 1128       Studies/Results: DG Abd Portable 1V Result Date: 04/24/2024 EXAM: 1 VIEW XRAY OF THE ABDOMEN 04/24/2024  10:55:00 AM COMPARISON: 04/22/2024 CLINICAL HISTORY: 881154 SBO (small bowel obstruction) (HCC) 881154. The patient has a small bowel obstruction (SBO). ICD10: 881154 - Small bowel obstruction. FINDINGS: BOWEL: A few gas distended small bowel loops in the left lower quadrant. Mild gaseous distention of the stomach. Colon decompressed. SOFT TISSUES: Cholecystectomy clips. BONES: Lumbar dextroscoliosis with multilevel spondylitic change. Post instrumented fixation L5 to S1, stable. No acute fracture. IMPRESSION: 1. mild gaseous distention of a few small bowel loops in the left lower quadrant, possible early/partial sbo. 2. Mild gaseous distention of the stomach with a decompressed colon. Electronically signed by: Katheleen Faes MD MD 04/24/2024 10:59 AM EST RP Workstation: [id number]    Anti-infectives: Anti-infectives (From admission, onward)    None        Assessment/Plan SBO -x-rays today looks ok.  No SBO noted. -no definitive bowel function yet as best as we can tell due to his dementia -tolerating CLD, adv to FLD, schedule miralax  BID   -daughter at bedside.    FEN - FLD, Ensure, miralax  BID VTE - Lovenox  ID - none  I reviewed nursing notes, hospitalist notes, last 24 h vitals and pain scores, last 48 h intake and output,  last 24 h labs and trends, and last 24 h imaging results.   LOS: 3 days    Burnard FORBES Banter , [facility] Surgery 04/24/2024, 11:09 AM Please see Amion for pager number during day hours 7:00am-4:30pm or 7:00am -11:30am on weekends  "

## 2024-04-24 NOTE — Progress Notes (Signed)
 Attending physician notified me upon arrival that patient had clinically decompensated regarding respiratory status and suspected recurrent aspiration.  Plans were to transfer patient to the ICU.  Upon my evaluation of the patient he did have increased work of breathing and coarse bilateral lung sounds.  Patient had been made a DNR earlier in the day.  Patient wanted to clarify along with family that he did wish to be intubated but no CPR no defibrillation.  Patient admitted with small bowel obstruction and plans were to reinsert NG tube after failing liquid diet trial.  Abdomen noted to be tight with greatly diminished bowel sounds.  Patient complaining of abdominal pain.  RT came to bedside and applied high flow nasal cannula but patient remained increased work of breathing.  ABG did reveal a pCO2 of 30 pO2 of 51.  Repeat chest x-ray revealed worsening of interstitial and ground glass opacities in the mid and lower lung zone suspicious for infection.  PCCM made aware patient and family now wish intubation.  Upon arrival to ICU room patient was noted to be in frank respiratory distress.  PCCM assumed care.  Please refer to their note regarding intubation etc.  Long discussions held with patient's family regarding clinical status and expectations.

## 2024-04-24 NOTE — NC FL2 (Signed)
 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[place]: [birth year] Sex: male Admission Date (Current Location): 04/20/2024  [facility] and [place] Number:  Producer, Television/film/video and Address:         Provider Number: [id number]  Attending Physician Name and Address:  Arlice Reichert, MD  Relative Name and Phone Number:  Marguerite Bristle, Daughter, Emergency Contact  [phone]    Current Level of Care: Hospital Recommended Level of Care: Skilled Nursing Facility Prior Approval Number:    Date Approved/Denied:   PASRR Number: Pending  Discharge Plan: SNF    Current Diagnoses: Patient Active Problem List   Diagnosis Date Noted   SBO (small bowel obstruction) (HCC) 04/20/2024   Upper respiratory tract infection 04/17/2024   Drug-induced constipation 11/22/2023   Actinic keratosis of left cheek 09/27/2023   Actinic keratosis 09/27/2023   Raised seborrheic keratosis 09/27/2023   Bilateral leg edema 08/02/2023   Vitamin D  deficiency 08/02/2023   Proximal limb muscle weakness 08/02/2023   Screening for diabetes mellitus (DM) 05/17/2023   Left hip pain 11/26/2022   Left leg weakness 10/27/2022   Mild late onset Alzheimer's dementia without behavioral disturbance, psychotic disturbance, mood disturbance, or anxiety (HCC) 09/20/2022   Weakness 04/19/2022   Ambulatory dysfunction 04/19/2022   Weight loss 04/19/2022   Back pain 04/19/2022   Kidney cysts 04/19/2022   Hypertension 03/26/2022   Osteoporosis 03/26/2022   Dyslipidemia 03/26/2022   Recurrent major depressive disorder, in full remission 03/26/2022    Orientation RESPIRATION BLADDER Height & Weight     Self, Time, Situation, Place  Normal Continent Weight: 165 lb (74.8 kg) Height:  6' (182.9 cm)  BEHAVIORAL SYMPTOMS/MOOD NEUROLOGICAL BOWEL NUTRITION STATUS      Continent Diet (Regular)  AMBULATORY STATUS COMMUNICATION OF NEEDS Skin   Extensive Assist Verbally  Normal                       Personal Care Assistance Level of Assistance  Bathing, Feeding, Dressing Bathing Assistance: Limited assistance Feeding assistance: Independent Dressing Assistance: Limited assistance     Functional Limitations Info  Sight, Hearing, Speech Sight Info: Adequate Hearing Info: Adequate Speech Info: Adequate    SPECIAL CARE FACTORS FREQUENCY  PT (By licensed PT), OT (By licensed OT)     PT Frequency: 5x per week OT Frequency: 5x per week            Contractures Contractures Info: Not present    Additional Factors Info  Code Status, Allergies Code Status Info: DNR Allergies Info: NKA           Current Medications (04/24/2024):  This is the current hospital active medication list Current Facility-Administered Medications  Medication Dose Route Frequency Provider Last Rate Last Admin   0.9 %  sodium chloride  infusion   Intravenous Continuous Dahal, Binaya, MD 100 mL/hr at 04/24/24 1057 New Bag at 04/24/24 1057   acetaminophen  (TYLENOL ) tablet 650 mg  650 mg Oral Q6H PRN Dahal, Reichert, MD       Or   acetaminophen  (TYLENOL ) suppository 650 mg  650 mg Rectal Q6H PRN Dahal, Binaya, MD       albuterol  (PROVENTIL ) (2.5 MG/3ML) 0.083% nebulizer solution 2.5 mg  2.5 mg Nebulization Q6H PRN Dahal, Binaya, MD       ARIPiprazole  (ABILIFY ) tablet 2 mg  2 mg Oral Daily Dahal, Binaya, MD   2 mg at 04/24/24 1056   bisacodyl  (  DULCOLAX) EC tablet 5 mg  5 mg Oral Daily Sheldon Standing, MD       chlorpheniramine-HYDROcodone (TUSSIONEX) 10-8 MG/5ML suspension 5 mL  5 mL Oral Q12H PRN Alto Isaiah CROME, NP   5 mL at 04/24/24 0618   enoxaparin  (LOVENOX ) injection 40 mg  40 mg Subcutaneous Q24H Dahal, Chapman, MD   40 mg at 04/23/24 2137   feeding supplement (ENSURE PLUS HIGH PROTEIN) liquid 237 mL  237 mL Oral BID BM Tammy Sor, PA-C       hydrALAZINE  (APRESOLINE ) injection 10 mg  10 mg Intravenous Q6H PRN Dahal, Chapman, MD       HYDROmorphone  (DILAUDID )  injection 1 mg  1 mg Intravenous Q3H PRN Dahal, Binaya, MD   1 mg at 04/23/24 1623   memantine  (NAMENDA ) tablet 10 mg  10 mg Oral BID Dahal, Binaya, MD   10 mg at 04/24/24 1032   ondansetron  (ZOFRAN ) injection 4 mg  4 mg Intravenous Q6H PRN Dahal, Chapman, MD   4 mg at 04/24/24 1031   pantoprazole  (PROTONIX ) EC tablet 40 mg  40 mg Oral Q1200 Sheldon Standing, MD       potassium chloride  SA (KLOR-CON  M) CR tablet 40 mEq  40 mEq Oral Once Dahal, Binaya, MD       sertraline  (ZOLOFT ) tablet 50 mg  50 mg Oral Daily Dahal, Binaya, MD   50 mg at 04/24/24 1056     Discharge Medications: Please see discharge summary for a list of discharge medications.  Relevant Imaging Results:  Relevant Lab Results:   Additional Information SSN: [id number]  Heather LABOR Paulyne Mooty, LCSW     "

## 2024-04-25 DIAGNOSIS — J9601 Acute respiratory failure with hypoxia: Secondary | ICD-10-CM

## 2024-04-25 DIAGNOSIS — R579 Shock, unspecified: Secondary | ICD-10-CM

## 2024-04-25 LAB — CBC WITH DIFFERENTIAL/PLATELET
Basophils Relative: 0 % (ref 0.0–0.1)
Eosinophils Absolute: 0 % (ref 0.0–0.5)
Eosinophils Relative: 0 % (ref 0.0–0.5)
HCT: 48.9 % (ref 39.0–52.0)
Hemoglobin: 16 g/dL (ref 13.0–17.0)
Lymphocytes Relative: 14 %
Lymphs Abs: 1.4 % (ref 0.7–4.0)
MCH: 30.5 pg (ref 26.0–34.0)
MCHC: 32.7 g/dL (ref 30.0–36.0)
MCV: 93.1 fL (ref 80.0–100.0)
Monocytes Absolute: 0 % (ref 0.1–1.0)
Monocytes Relative: 0.2 % (ref 0.1–1.0)
Monocytes Relative: 2 % (ref 0.7–4.0)
Neutro Abs: 8 K/uL — AB (ref 1.7–7.7)
Neutrophils Relative %: 80 %
Other: 0.37 % — AB (ref 0.00–0.07)
Platelets: 291 K/uL (ref 150–400)
RBC: 5.25 MIL/uL (ref 4.22–5.81)
RDW: 14.3 % (ref 11.5–15.5)
Smear Review: 4 %
Smear Review: NORMAL
WBC: 10 K/uL (ref 4.0–10.5)
nRBC: 0.6 % — ABNORMAL HIGH (ref 0.0–0.2)

## 2024-04-25 LAB — C-REACTIVE PROTEIN: CRP: 23.2 mg/dL — ABNORMAL HIGH

## 2024-04-25 LAB — LACTIC ACID, PLASMA: Lactic Acid, Venous: 5.1 mmol/L (ref 0.5–1.9)

## 2024-04-25 LAB — APTT: aPTT: <22 s — ABNORMAL LOW (ref 24–36)

## 2024-04-25 MED ORDER — GLYCOPYRROLATE 1 MG PO TABS: 1.0000 mg | ORAL_TABLET | ORAL | Status: DC | PRN

## 2024-04-25 MED ORDER — ACETAMINOPHEN 325 MG PO TABS: 650.0000 mg | ORAL_TABLET | Freq: Four times a day (QID) | ORAL | Status: DC | PRN

## 2024-04-25 MED ORDER — HALOPERIDOL LACTATE 5 MG/ML IJ SOLN: 2.5000 mg | INTRAMUSCULAR | Status: DC | PRN

## 2024-04-25 MED ORDER — MIDAZOLAM HCL (PF) 2 MG/2ML IJ SOLN
2.0000 mg | INTRAMUSCULAR | Status: DC | PRN
  Filled 2024-04-25: qty 2

## 2024-04-25 MED ORDER — ONDANSETRON HCL 4 MG/2ML IJ SOLN: 4.0000 mg | Freq: Four times a day (QID) | INTRAMUSCULAR | Status: DC | PRN

## 2024-04-25 MED ORDER — POLYVINYL ALCOHOL 1.4 % OP SOLN: 1.0000 [drp] | Freq: Four times a day (QID) | OPHTHALMIC | Status: DC | PRN

## 2024-04-25 MED ORDER — GLYCOPYRROLATE 0.2 MG/ML IJ SOLN: 0.2000 mg | INTRAMUSCULAR | Status: DC | PRN

## 2024-04-25 MED ORDER — SODIUM CHLORIDE 0.9 % IV SOLN: INTRAVENOUS | Status: DC

## 2024-04-25 MED ORDER — MORPHINE SULFATE (PF) 2 MG/ML IV SOLN
2.0000 mg | INTRAVENOUS | Status: DC | PRN
  Filled 2024-04-25: qty 1

## 2024-04-25 MED ORDER — VANCOMYCIN HCL 1250 MG/250ML IV SOLN: 1250.0000 mg | INTRAVENOUS | Status: DC

## 2024-04-25 MED ORDER — GLYCOPYRROLATE 0.2 MG/ML IJ SOLN
0.2000 mg | INTRAMUSCULAR | Status: DC | PRN
  Administered 2024-04-25: 0.2 mg via INTRAVENOUS
  Filled 2024-04-25: qty 1

## 2024-04-25 MED ORDER — ACETAMINOPHEN 650 MG RE SUPP: 650.0000 mg | Freq: Four times a day (QID) | RECTAL | Status: DC | PRN

## 2024-04-25 MED ORDER — ALBUTEROL SULFATE (2.5 MG/3ML) 0.083% IN NEBU: 2.5000 mg | INHALATION_SOLUTION | RESPIRATORY_TRACT | Status: DC | PRN

## 2024-04-25 MED ORDER — DIPHENHYDRAMINE HCL 50 MG/ML IJ SOLN: 25.0000 mg | INTRAMUSCULAR | Status: DC | PRN

## 2024-04-25 MED ORDER — ONDANSETRON 4 MG PO TBDP: 4.0000 mg | ORAL_TABLET | Freq: Four times a day (QID) | ORAL | Status: DC | PRN

## 2024-05-13 NOTE — Progress Notes (Signed)
 Patient expired at 03:28 with his daughters at bedside. This RN and Investment Banker, Operational verified and pronounced dead. Elink RN and MD notified.

## 2024-05-13 NOTE — Progress Notes (Signed)
 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 Bilateral Breath Sounds: Rhonchi, Diminished   One way extubation  Leita JINNY Helling 2024/05/15, 1:01 AM

## 2024-05-13 NOTE — Progress Notes (Signed)
 The patient's daughters at bedside spoke with Dr Haze regarding patient's condition and possible outcome. At 00:27 they decided to withdrawal of care. RN followed MD's order to transition to full comfort, turn off pressors and sedation and extubated at 00:48 by RT.  Fentanyl  IV 200 ml was wasted and disposed with Estate Manager/land Agent.

## 2024-05-13 NOTE — Death Summary Note (Signed)
 " DEATH SUMMARY   Patient Details  Name: Andrew Poole, Dr. MRN: [id number] DOB: 1935-09-27  Admission/Discharge Information   Admit Date:  05/11/2024  Date of Death: Date of Death: 05-16-2024  Time of Death: Time of Death: 0328  Length of Stay: 4  Referring Physician: Caleen Dirks, MD   Reason(s) for Hospitalization  sbo  Diagnoses  Preliminary cause of death: Small bowel obstruction (HCC) Secondary Diagnoses (including complications and co-morbidities):  Principal Problem:   SBO (small bowel obstruction) (HCC) Active Problems:   Shock (HCC)   Acute hypoxic respiratory failure (HCC)  OF NOTE PT WAS ADMITTED AND TREATED UNDER HOSPITALIST SERVICE SINCE 1.9. TRANSFERRED TO ICU FOR ASPIRATION AND ACUTE HYPOXIC RESP FAILURE AS WELL AS SHOCK AND WAS ON CCM SERVICE FOR LESS THAN 12 HOURS OF HIS MULTI-DAY HOSPITALIZATION. 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  Unable to recall when the last bowel movement was, suspects in the last few days. No dysuria, hematuria. Reports past h/o appendectomy, cholecystectomy but has not had any history of bowel obstruction since then.   In the ED, patient was afebrile, heart rate 50, blood pressure 128/61, breathing on room air CBC, CMP unremarkable CT abdomen pelvis showed SBO with a right lower quadrant transition zone.   EDP discussed with general surgery.  Recommend NG tube insertion. Started on conservative management of SBO with IV fluid, IV analgesics, antiemetics Hospitalist service was consulted for inpatient management . At the time of my evaluation, patient was lying on bed.  Had a mild distress from abdominal  pain. One of her daughters was at bedside. Patient lives in an independent living facility, does not use any assistive devices.   Surgery consulted 05-11-24: This is a very pleasant 89 yo white male, retired sports administrator, with a history of dementia, HTN, and HLD who presents to the [facility] with c/o abdominal pain that started this morning. His daughter at the bedside provides most of his history.  He apparently was doing well last night when she spoke to him, but he called her this morning stating his abdomen was hurting.  He has apparently vomited several times since then.  He is not sure nor is the daughter when he last had a BM or flatus.  He has had a prior appendectomy and cholecystectomy that I can tell on exam.     In the ED he has been found to have a SBO on CT scan and we have been asked to see him.  Treated conservatively with ng and bowel rest.  1/10: Patient was seen and examined this morning. Lying on bed.  Not in distress.  No flatus or bowel movement yet.  2 daughters at bedside. Afebrile, hemodynamically stable, breathing on room air Labs this morning with renal function and electrolytes stable  1/11: surgery: Still complains of soreness in right abdomen. No flatus yet  TRH: Patient was seen and examined this morning. Lying on bed.  Not in distress more awake.  Has NG decompression ongoing.  Daughter at bedside. Was out of bed on the recliner this morning No flatus or BM yet. Continues to have abdominal pain   Afebrile, hemodynamically stable, breathing on  2 L Labs this morning with sodium 146, potassium 5.2, BUN/creatinine worse at 35/1.05, phosphorus low at 2.1  1/12: surgery:Presentation suspicious for obstruction but no hard evidence of obstructive patterns at this point.  No recurrent nausea vomiting after pulling NG tube out last night.   Will call the question and try clear liquids and see how it goes.  Replace NG tube if he has recurrent emesis may need to repeat small bowel  protocol with swallowed Gastrografin  if imaging is not that consistent.   Agree with constipation regimen as tolerated.   No indication for surgery at this time  TRH:  Patient was seen and examined this morning. Sitting up in recliner.  Not in distress.  Alert, awake Monito x 3. Events of last night noted. Patient accidentally pulled out NG tube.  Family wanted to leave it out until morning. Patient is unable to tell if he is passing gas.  Has not had a bowel movement. Seen by general surgery this morning. Afebrile, hemodynamically stable with blood pressure 140s, breathing on room air Lab this morning with normal sodium and potassium. CBC normal  1/13: surgery: Patient send up in chair.  Daughter is in room.  Patient had confusion and pulled NG tube out.   Patient had an episode of emesis with coughing.  Not a high volume.  Has been drinking liquids and feeling better.  Did hiccup once but denies any nausea or vomiting.  Abdomen is somewhat twitchy.  Family does not know if it is much worse.  Patient feels it is about his baseline.  No major bowel movements.   Would not want to rush to operate on this patient but he has been here a while.  I think a reasonable compromise is to try and do the small bowel protocol with him swallowing the Gastrografin  and see if he has contrast going down to his colon.  He if he is, that argues against any persisted bowel obstruction and workup should continue to figure out other reasons why is having nausea or vomiting.  If he truly has a persistent bowel obstruction and contrast does not get in the colon by 48 hours, then I would lean towards surgery.  Diagnostic laparoscopy possible laparotomy to rule out an obstructing area.  Given his advanced age and dementia, would like to hold off on general anesthesia and surgery but I also do not want to keep in this forever.   Do not know for reaching a point where he should consider TPN.  If does not open within the  next 48 hours would consider starting it   Given the fact that he has had some coughing with eating I wonder if he would benefit from a speech therapy formal evaluation.  That would allow him to have a cognitive assessment as well.  Make sure he is not having many aspiration or other reasons why he could be having coughing or tussive emesis.  TRH: Patient was seen and examined this morning.   Was seen by general surgery this morning and was advanced from clear to full liquid diet.  But according to the daughter, patient threw up bilious looking emesis after that.   Abdominal x-ray was done and was pending at that time. Patient was on bedside commode trying to have bowel movement.  3 daughters outside the room.  Had a long conversation with daughters.   Afebrile, hemodynamically stable, breathing on room air. No labs this morning   CCM consult 1/13: During pt's  hospitalization he pulled out his ng during periods of confusion. Was started on clear liquids and had repeated emesis. He was to undergo a small bowel protocol and replacement of NG in hopes of avoiding surgery.    Unfortunately this evening pt decompensated on the floor, suspected aspiration and subsequent increased wob, hypoxia req HHFNC 60L 100% and transfer to ICU.    Pt was originally DNR/DNI but rescinded his desire to not be intubated. CCM was consulted for eval.    Upon arrival to ICU pt was hypoxic to the low 80's on max hhfnc. He was utilizing accessory muscles for breathing. He was unable to speak in full sentences. Daughters at bedside and endorse his desire for intubation.   1/13 update ccm:   Cross covering ICU physician   Lengthy conversation with family re: pt's critical condition. Escalating pressors, progressive organ failure, profound hypoxic resp failure. Pt tachypneic on vent and was attempting to minimize sedation in light of profound shock. Family has requested prioritization of comfort while continuing with  efforts. They expressed understanding that optimizing sedation/comfort will further decrease BP. They will remain at bedside thru the evening, aware that pt may very well expire tonight. He remains DNR without shock as well.    Discussed not adding epi in light of HF and rate of 150's. They expressed undestanding of this as well and are in agreement with norepi and vaso. Will increase to 60mcg as needed to maintain map >65.    RN updated as well.      RN note 10-May-2024: The patient's daughters at bedside spoke with Dr Haze regarding patient's condition and possible outcome. At 00:27 they decided to withdrawal of care. RN followed MD's order to transition to full comfort, turn off pressors and sedation and extubated at 00:48 by RT.   Fentanyl  IV 200 ml was wasted and disposed with Estate Manager/land Agent.   Pt was transitioned to comfort care and expired 2024-05-10 at 0328   Pertinent Labs and Studies  Significant Diagnostic Studies DG Abd 1 View Result Date: 04/24/2024 EXAM: 1 VIEW XRAY OF THE ABDOMEN 04/24/2024 10:06:00 PM COMPARISON: 04/24/2024 CLINICAL HISTORY: The patient presents for orogastric (OG) tube placement. ICD10: 747667 - Encounter for orogastric (OG) tube placement. FINDINGS: LINES, TUBES AND DEVICES: Enteric tube in place with tip and side port in the expected region of the mid body of the stomach. Central venous catheter in place with tip overlying the expected location of the superior vena cava junction. BOWEL: Nonobstructive bowel gas pattern. SOFT TISSUES: Right upper quadrant surgical clips noted. No abnormal calcifications. BONES: L5-S1 surgical hardware noted. Dextroscoliosis of the lumbar spine. No acute fracture. LUNGS: Right basilar consolidation. IMPRESSION: 1. Enteric tube tip and side port overlie the mid body of the stomach. 2. Right basilar consolidation. Electronically signed by: Dorethia Molt MD 04/24/2024 10:10 PM EST RP Workstation: [id number]   DG CHEST PORT 1 VIEW Result Date:  04/24/2024 EXAM: 1 VIEW(S) XRAY OF THE CHEST 04/24/2024 09:21:40 PM COMPARISON: Comparison to study dated 04/24/2024. CLINICAL HISTORY: History of difficult intubation. ICD10: 410942 Difficult intubation. FINDINGS: LINES, TUBES AND DEVICES: New endotracheal tube with tip 6.2 cm above the carina. Left internal jugular central venous catheter with tip in mid SVC. LUNGS AND PLEURA: Progressive right perihilar and basilar consolidation. Stable left perihilar and retrocardiac airspace infiltrate in keeping with changes of multifocal infection. Trace right pleural effusion. No pneumothorax. HEART AND MEDIASTINUM: No acute abnormality of the cardiac and mediastinal silhouettes. BONES AND SOFT TISSUES: No  acute osseous abnormality. IMPRESSION: 1. Endotracheal tube tip 6.2 cm above the carina. 2. Left internal jugular central venous catheter tip in the mid SVC. 3. Progressive right perihilar and basilar consolidation. Stable left perihilar and retrocardiac airspace infiltrate, consistent with multifocal infection. 4. Trace right pleural effusion. Electronically signed by: Dorethia Molt MD 04/24/2024 09:25 PM EST RP Workstation: [id number]   DG CHEST PORT 1 VIEW Result Date: 04/24/2024 CLINICAL DATA:  Hypoxia EXAM: PORTABLE CHEST 1 VIEW COMPARISON:  04/24/2024, chest CT 01/14/2023 FINDINGS: Small pleural effusions and bibasilar airspace disease. Slight worsening of interstitial and ground-glass opacity in the mid to lower lung zones. Stable cardiomediastinal silhouette. No pneumothorax IMPRESSION: Small pleural effusions with bibasilar consolidations. Slight worsening of interstitial and ground-glass opacity in the mid to lower lung zones, suspect for infection Electronically Signed   By: Luke Bun M.D.   On: 04/24/2024 19:30   DG CHEST PORT 1 VIEW Result Date: 04/24/2024 CLINICAL DATA:  Shortness of breath. EXAM: PORTABLE CHEST 1 VIEW COMPARISON:  Chest radiograph dated 04/11/2022. FINDINGS: Diffuse bilateral mid  to lower lung field interstitial densities new or progressed since the prior radiograph and concerning for pneumonia. No pleural effusion pneumothorax. The cardiac silhouette is within normal limits. No acute osseous pathology. IMPRESSION: Diffuse bilateral mid to lower lung field interstitial densities concerning for pneumonia. Electronically Signed   By: Vanetta Chou M.D.   On: 04/24/2024 16:04   DG Abd Portable 1V Result Date: 04/24/2024 EXAM: 1 VIEW XRAY OF THE ABDOMEN 04/24/2024 10:55:00 AM COMPARISON: 04/22/2024 CLINICAL HISTORY: [id number] SBO (small bowel obstruction) (HCC) [id number]. The patient has a small bowel obstruction (SBO). ICD10: 881154 - Small bowel obstruction. FINDINGS: BOWEL: A few gas distended small bowel loops in the left lower quadrant. Mild gaseous distention of the stomach. Colon decompressed. SOFT TISSUES: Cholecystectomy clips. BONES: Lumbar dextroscoliosis with multilevel spondylitic change. Post instrumented fixation L5 to S1, stable. No acute fracture. IMPRESSION: 1. mild gaseous distention of a few small bowel loops in the left lower quadrant, possible early/partial sbo. 2. Mild gaseous distention of the stomach with a decompressed colon. Electronically signed by: Katheleen Faes MD MD 04/24/2024 10:59 AM EST RP Workstation: [id number]   DG Abd 2 Views Result Date: 04/22/2024 CLINICAL DATA:  Small bowel obstruction. EXAM: ABDOMEN - 2 VIEW COMPARISON:  04/21/2024. FINDINGS: Upright film shows no evidence for intraperitoneal free air. NG tube no longer evident. Patchy airspace disease is seen in both lung bases, left greater than right. Supine imaging shows no gaseous bowel dilatation to suggest obstruction. There is gas and stool seen scattered along the colon. Convex rightward lumbar scoliosis evident with lower lumbar fixation hardware. IMPRESSION: 1. Nonobstructive bowel gas pattern. 2. Patchy airspace disease in both lung bases, left greater than right. Electronically Signed    By: Camellia Candle M.D.   On: 04/22/2024 07:59   DG Abd Portable 1V-Small Bowel Obstruction Protocol-initial, 8 hr delay Result Date: 04/21/2024 CLINICAL DATA:  8 hour delay for small bowel obstruction. EXAM: PORTABLE ABDOMEN - 1 VIEW COMPARISON:  04/20/2024 and CT abdomen pelvis 04/20/2024. FINDINGS: Nasogastric tube tip is just beyond the gastroesophageal junction. Scattered colonic gas. Faint contrast in the small bowel. Excreted contrast in the bladder. Streaky atelectasis in the lung bases. Severe dextroconvex scoliosis. L4-5 fusion. IMPRESSION: 1. Assessment for ongoing small bowel obstruction is somewhat challenging given faint contrast within the small bowel. Scattered gas and stool in the colon argue against a high-grade small bowel obstruction. If further evaluation is desired, CT abdomen  pelvis with contrast is suggested. 2. Nasogastric tube has retracted in the interval. Advanced sing approximately 8-10 cm would better position the side port in stomach. Electronically Signed   By: Newell Eke M.D.   On: 04/21/2024 10:56   DG Abd Portable 1V-Small Bowel Protocol-Position Verification Result Date: 04/20/2024 EXAM: 1 VIEW XRAY OF THE ABDOMEN 04/20/2024 03:15:00 PM COMPARISON: None available. CLINICAL HISTORY: Encounter for imaging study to confirm nasogastric (NG) tube placement. FINDINGS: LINES, TUBES AND DEVICES: Enteric tube in place with tip and side port overlying the expected region of the gastric body. Enteric tube likely within the gastric lumen. BOWEL: Nonobstructive bowel gas pattern. SOFT TISSUES: Right upper quadrant surgical clips noted. No abnormal calcifications. BONES: No acute fracture. IMPRESSION: 1. Enteric tube tip and side port overlie the expected region of the gastric body. Electronically signed by: Greig Pique MD MD 04/20/2024 03:46 PM EST RP Workstation: [id number]   CT ABDOMEN PELVIS W CONTRAST Result Date: 04/20/2024 CLINICAL DATA:  Suspected pancreatitis. EXAM: CT  ABDOMEN AND PELVIS WITH CONTRAST TECHNIQUE: Multidetector CT imaging of the abdomen and pelvis was performed using the standard protocol following bolus administration of intravenous contrast. RADIATION DOSE REDUCTION: This exam was performed according to the departmental dose-optimization program which includes automated exposure control, adjustment of the mA and/or kV according to patient size and/or use of iterative reconstruction technique. CONTRAST:  OMNIPAQUE  IOHEXOL  300 MG/ML  SOLN COMPARISON:  None Available. FINDINGS: Lower chest: No acute abnormality. Hepatobiliary: There is diffuse fatty infiltration of the liver parenchyma. A 2.4 cm hepatic cyst is seen within the left lobe of the liver. Status post cholecystectomy. No biliary dilatation. Pancreas: Unremarkable. No pancreatic ductal dilatation or surrounding inflammatory changes. Spleen: Numerous subcentimeter calcified granulomas are seen scattered throughout the spleen. Adrenals/Urinary Tract: Adrenal glands are unremarkable. Kidneys are normal in size, without renal calculi or hydronephrosis. Subcentimeter simple cysts are seen scattered throughout the right kidney. Bladder is unremarkable. Stomach/Bowel: There is a small hiatal hernia. The appendix is not clearly identified. Dilated small bowel loops are seen within the right lower quadrant (maximum small bowel diameter of 3.5 cm). A right lower quadrant transition zone is also noted within the expected region of the distal ileum. Noninflamed diverticula are seen throughout the sigmoid colon Vascular/Lymphatic: Aortic atherosclerosis. No enlarged abdominal or pelvic lymph nodes. Reproductive: The prostate gland is markedly enlarged. Other: No abdominal wall hernia or abnormality. No abdominopelvic ascites. Musculoskeletal: Postoperative changes are seen within the lower lumbar spine with multilevel degenerative changes present throughout the lower thoracic spine and lumbar spine. IMPRESSION: 1.  Small bowel obstruction with a right lower quadrant transition zone. 2. Hepatic steatosis. 3. Evidence of prior cholecystectomy. 4. Small hiatal hernia. 5. Sigmoid diverticulosis. 6. Markedly enlarged prostate gland. 7. Aortic atherosclerosis. Electronically Signed   By: Suzen Dials M.D.   On: 04/20/2024 13:42    Microbiology Recent Results (from the past 240 hours)  MRSA Next Gen by PCR, Nasal     Status: None   Collection Time: 04/24/24  8:28 PM   Specimen: Nasal Mucosa; Nasal Swab  Result Value Ref Range Status   MRSA by PCR Next Gen NOT DETECTED NOT DETECTED Final    Comment: (NOTE) The GeneXpert MRSA Assay (FDA approved for NASAL specimens only), is one component of a comprehensive MRSA colonization surveillance program. It is not intended to diagnose MRSA infection nor to guide or monitor treatment for MRSA infections. Test performance is not FDA approved in patients less than 2 years  old. Performed at [facility], [address] [place], [place] [address]     Lab Basic Metabolic Panel: Recent Labs  Lab 04/21/24 0601 04/22/24 0549 04/23/24 0528 04/24/24 1321 04/24/24 2058  NA 143 146* 145 142 143  K 4.3 5.2* 3.5 3.4* 3.4*  CL 106 109 109 108 106  CO2 29 27 25 23  20*  GLUCOSE 118* 132* 123* 103* 101*  BUN 24* 35* 31* 28* 32*  CREATININE 1.03 1.05 0.75 0.74 1.41*  CALCIUM  8.6* 9.2 8.8* 8.5* 8.7*  MG  --  2.8*  --   --   --   PHOS  --  2.1*  --   --   --    Liver Function Tests: Recent Labs  Lab 04/20/24 1128 04/24/24 2058  AST 36 43*  ALT 34 23  ALKPHOS 67 81  BILITOT 0.6 2.5*  PROT 6.9 6.6  ALBUMIN 4.0 3.3*   Recent Labs  Lab 04/20/24 1128  LIPASE 25   No results for input(s): AMMONIA in the last 168 hours. CBC: Recent Labs  Lab 04/21/24 0601 04/22/24 0549 04/23/24 0528 04/24/24 1321 04/24/24 2254  WBC 9.4 9.4 7.5 11.8* 10.0  NEUTROABS  --  7.9* 6.3  --  8.0*  HGB 12.9* 14.4 13.5 14.4 16.0  HCT 38.5* 43.3 40.7  42.1 48.9  MCV 91.2 91.7 90.4 89.6 93.1  PLT 145* 139* 143* 170 291   Cardiac Enzymes: No results for input(s): CKTOTAL, CKMB, CKMBINDEX, TROPONINI in the last 168 hours. Sepsis Labs: Recent Labs  Lab 04/22/24 0549 04/23/24 0528 04/24/24 1321 04/24/24 2058 04/24/24 2254  PROCALCITON  --   --   --  4.12  --   WBC 9.4 7.5 11.8*  --  10.0  LATICACIDVEN  --   --   --   --  5.1*    Procedures/Operations  Ett Cvc Arterial line Ng tube placement   Harlene Na May 14, 2024, 6:12 PM   "

## 2024-05-13 DEATH — deceased
